# Patient Record
Sex: Male | Born: 1945
Health system: Southern US, Community
[De-identification: ages and names within clinical notes are randomized; demographics above are authoritative.]

## PROBLEM LIST (undated history)

## (undated) DIAGNOSIS — I509 Heart failure, unspecified: Secondary | ICD-10-CM

## (undated) DIAGNOSIS — R7303 Prediabetes: Secondary | ICD-10-CM

## (undated) HISTORY — DX: Prediabetes: R73.03

## (undated) HISTORY — DX: Heart failure, unspecified: I50.9

## (undated) HISTORY — PX: HERNIA REPAIR: SHX51

---

## 2014-03-01 HISTORY — PX: CYSTOSCOPY: SUR368

## 2014-07-01 ENCOUNTER — Encounter: Payer: Self-pay | Admitting: Family Medicine

## 2014-07-01 ENCOUNTER — Ambulatory Visit (INDEPENDENT_AMBULATORY_CARE_PROVIDER_SITE_OTHER): Payer: Commercial Managed Care - HMO | Admitting: Family Medicine

## 2014-07-01 VITALS — BP 132/82 | HR 75 | Ht 69.0 in | Wt 175.0 lb

## 2014-07-01 DIAGNOSIS — M545 Low back pain, unspecified: Secondary | ICD-10-CM | POA: Insufficient documentation

## 2014-07-01 DIAGNOSIS — E785 Hyperlipidemia, unspecified: Secondary | ICD-10-CM | POA: Insufficient documentation

## 2014-07-01 DIAGNOSIS — I10 Essential (primary) hypertension: Secondary | ICD-10-CM | POA: Insufficient documentation

## 2014-07-01 DIAGNOSIS — I739 Peripheral vascular disease, unspecified: Secondary | ICD-10-CM | POA: Diagnosis not present

## 2014-07-01 NOTE — Progress Notes (Signed)
CC: David Maldonado is a 69 y.o. male is here for Establish Care   Subjective: HPI:  Very pleasant 69 year old here to establish care.  Complains of a history of essential hypertension that's been spanning back for the last 9 years. He's been taking lisinopril for this and tells me that it's been well controlled ever since starting this low dose of 5 mg daily. Denies chest pain shortness of breath orthopnea nor peripheral edema. He exercises most days of the week and enjoys golfing  Reports a history of hyperlipidemia and has been taking pravastatin for an unknown amount of time. He was once on simvastatin however on the first day of taking this medication caused intolerable myalgias all over the body. He denies any known side effects from pravastatin. He denies any known cardiovascular disease other than hypertension and being told that he had asymptomatic peripheral arterial disease. He denies any limb claudication or chest pain with exertion.  His only complaint today is low back pain that hasn't present for at least a year now and is only present first thing in the morning for about 30 minutes. It's described as an ache improves with bending forward and nothing particularly makes it better or worse other than improvement with time. Symptom is mild in severity and had not been getting better or worsen the past year. He is extremely active during the day without his back pain present while swimming, play basketball or golfing. Denies radiation of pain, bowel or bladder incontinence  Review of Systems - General ROS: negative for - chills, fever, night sweats, weight gain or weight loss Ophthalmic ROS: negative for - decreased vision Psychological ROS: negative for - anxiety or depression ENT ROS: negative for - hearing change, nasal congestion, tinnitus or allergies Hematological and Lymphatic ROS: negative for - bleeding problems, bruising or swollen lymph nodes Breast ROS: negative Respiratory  ROS: no cough, shortness of breath, or wheezing Cardiovascular ROS: no chest pain or dyspnea on exertion Gastrointestinal ROS: no abdominal pain, change in bowel habits, or black or bloody stools Genito-Urinary ROS: negative for - genital discharge, genital ulcers, incontinence or abnormal bleeding from genitals Musculoskeletal ROS: negative for - joint pain or muscle pain other than that described above Neurological ROS: negative for - headaches or memory loss Dermatological ROS: negative for lumps, mole changes, rash and skin lesion changes  Past Medical History  Diagnosis Date  . CHF (congestive heart failure)     History reviewed. No pertinent past surgical history. History reviewed. No pertinent family history.  History   Social History  . Marital Status: Married    Spouse Name: N/A  . Number of Children: N/A  . Years of Education: N/A   Occupational History  . Not on file.   Social History Main Topics  . Smoking status: Current Every Day Smoker -- 0.50 packs/day    Types: Cigars  . Smokeless tobacco: Not on file  . Alcohol Use: Yes     Comment: 10 drinks a wk  . Drug Use: No  . Sexual Activity: No   Other Topics Concern  . Not on file   Social History Narrative  . No narrative on file     Objective: BP 132/82 mmHg  Pulse 75  Ht 5\' 9"  (1.753 m)  Wt 175 lb (79.379 kg)  BMI 25.83 kg/m2  General: Alert and Oriented, No Acute Distress HEENT: Pupils equal, round, reactive to light. Conjunctivae clear.  Moist mucous membranes Lungs: Clear to auscultation bilaterally, no  wheezing/ronchi/rales.  Comfortable work of breathing. Good air movement. Cardiac: Regular rate and rhythm. Normal S1/S2.  No murmurs, rubs, nor gallops.   Back: Full range of motion and strength in the lumbar spine Extremities: No peripheral edema.  Strong peripheral pulses.  Mental Status: No depression, anxiety, nor agitation. Skin: Warm and dry.  Assessment & Plan: [name] was seen today for  establish care.  Diagnoses and all orders for this visit:  Essential hypertension  Hyperlipidemia  Low back pain without sciatica, unspecified back pain laterality  Peripheral artery disease   Essential hypertension: Controlled on lisinopril 5 mg daily Hyperlipidemia: It sounds like he's had his cholesterol checked within the last year, requesting outside records from the [facility] continue pravastatin pending review of results Low back pain: Suspect osteoarthritis from the facet joints in the lumbar spine, he's not interested in medication which is understandable but is open to a home rehabilitative plan that I provided him today Peripheral artery disease: He is already on a statin and aspirin. Asymptomatic no further intervention.  Return for Schedule an annual physical exam sometime in the next 3 months, we'll do bloodwork duing that visit.Marland Kitchen

## 2014-09-06 LAB — LIPID PANEL
Cholesterol: 186 mg/dL (ref 0–200)
HDL: 40 mg/dL (ref 35–70)
LDL CALC: 108 mg/dL
TRIGLYCERIDES: 188 mg/dL — AB (ref 40–160)

## 2014-09-06 LAB — HEPATIC FUNCTION PANEL
ALT: 32 U/L (ref 10–40)
AST: 23 U/L (ref 14–40)
Alkaline Phosphatase: 66 U/L (ref 25–125)

## 2014-09-06 LAB — BASIC METABOLIC PANEL
Creatinine: 1 mg/dL (ref 0.6–1.3)
Glucose: 94 mg/dL
Potassium: 4.3 mmol/L (ref 3.4–5.3)
SODIUM: 139 mmol/L (ref 137–147)

## 2014-09-06 LAB — CBC AND DIFFERENTIAL
Hemoglobin: 14.7 g/dL (ref 13.5–17.5)
Platelets: 313 10*3/uL (ref 150–399)
WBC: 6.4 10^3/mL

## 2014-09-06 LAB — COMPREHENSIVE METABOLIC PANEL
CALCIUM: 8.7
Protein, Total: 8.5

## 2014-09-06 LAB — VITAMIN D 25 HYDROXY (VIT D DEFICIENCY, FRACTURES): Vit D, 25-Hydroxy: 36.58

## 2014-10-01 ENCOUNTER — Encounter: Payer: Self-pay | Admitting: Family Medicine

## 2014-10-01 ENCOUNTER — Ambulatory Visit (INDEPENDENT_AMBULATORY_CARE_PROVIDER_SITE_OTHER): Payer: Commercial Managed Care - HMO | Admitting: Family Medicine

## 2014-10-01 VITALS — BP 134/89 | HR 59 | Ht 69.0 in | Wt 171.0 lb

## 2014-10-01 DIAGNOSIS — Z0000 Encounter for general adult medical examination without abnormal findings: Secondary | ICD-10-CM

## 2014-10-01 DIAGNOSIS — Z1211 Encounter for screening for malignant neoplasm of colon: Secondary | ICD-10-CM

## 2014-10-01 DIAGNOSIS — E785 Hyperlipidemia, unspecified: Secondary | ICD-10-CM

## 2014-10-01 DIAGNOSIS — I10 Essential (primary) hypertension: Secondary | ICD-10-CM

## 2014-10-01 MED ORDER — PRAVASTATIN SODIUM 40 MG PO TABS: 40.0000 mg | ORAL_TABLET | Freq: Every day | ORAL | Status: DC

## 2014-10-01 MED ORDER — LISINOPRIL 20 MG PO TABS: 10.0000 mg | ORAL_TABLET | Freq: Two times a day (BID) | ORAL | Status: DC

## 2014-10-01 NOTE — Progress Notes (Signed)
Subjective:    David Maldonado is a 69 y.o. male who presents for Medicare Annual/Subsequent preventive examination.   Preventive Screening-Counseling & Management  Tobacco History  Smoking status  . Current Every Day Smoker -- 0.50 packs/day  . Types: Cigars  Smokeless tobacco  . Not on file   Colonoscopy: Overdue, referral placed today and urged to go though with procedure once contacted  Prostate: Discussed screening risks/beneifts with patient today, PSA in July was normal  Influenza Vaccine:  Declined Pneumovax: Declined Td/Tdap: Up-to-date Zoster: Declined  Recently had cholesterol done, LDL was just barely above 100, his [place] doctor has recommended he increase pravastatin. He states he is not interested in this.   Problems Prior to Visit 1. HLD, HTN, PVD  Current Problems (verified) Patient Active Problem List   Diagnosis Date Noted  . Essential hypertension 07/01/2014  . Hyperlipidemia 07/01/2014  . Lumbago 07/01/2014  . Peripheral artery disease 07/01/2014    Medications Prior to Visit Current Outpatient Prescriptions on File Prior to Visit  Medication Sig Dispense Refill  . aspirin 81 MG tablet Take 81 mg by mouth daily.     No current facility-administered medications on file prior to visit.    Current Medications (verified) Current Outpatient Prescriptions  Medication Sig Dispense Refill  . aspirin 81 MG tablet Take 81 mg by mouth daily.    Marland Kitchen lisinopril (PRINIVIL,ZESTRIL) 20 MG tablet Take 0.5 tablets (10 mg total) by mouth 2 (two) times daily. Take one tablet in the morning and one in the evening 90 tablet 2  . pravastatin (PRAVACHOL) 40 MG tablet Take 1 tablet (40 mg total) by mouth daily. 90 tablet 2   No current facility-administered medications for this visit.     Allergies (verified) Simvastatin   PAST HISTORY  Family History No family history on file.  Social History History  Substance Use Topics  . Smoking status: Current Every Day  Smoker -- 0.50 packs/day    Types: Cigars  . Smokeless tobacco: Not on file  . Alcohol Use: Yes     Comment: 10 drinks a wk    Are there smokers in your home (other than you)?  No  Risk Factors Current exercise habits: Home exercise routine includes swimming, golf.  Dietary issues discussed: DASH   Cardiac risk factors: advanced age (older than 54 for men, 1 for women), dyslipidemia, hypertension, male gender and smoking/ tobacco exposure.  Depression Screen (Note: if answer to either of the following is "Yes", a more complete depression screening is indicated)   Q1: Over the past two weeks, have you felt down, depressed or hopeless? No  Q2: Over the past two weeks, have you felt little interest or pleasure in doing things? No  Have you lost interest or pleasure in daily life? No  Do you often feel hopeless? No  Do you cry easily over simple problems? No  Activities of Daily Living In your present state of health, do you have any difficulty performing the following activities?:  Driving? No Managing money?  No Feeding yourself? No Getting from bed to chair? No Climbing a flight of stairs? No Preparing food and eating?: No Bathing or showering? No Getting dressed: No Getting to the toilet? No Using the toilet:No Moving around from place to place: No In the past year have you fallen or had a near fall?:No   Are you sexually active?  No  Do you have more than one partner?  No  Hearing Difficulties: No Do you  often ask people to speak up or repeat themselves? No Do you experience ringing or noises in your ears? No Do you have difficulty understanding soft or whispered voices? No   Do you feel that you have a problem with memory? No  Do you often misplace items? No  Do you feel safe at home?  Yes  Cognitive Testing  Alert? Yes  Normal Appearance?Yes  Oriented to person? Yes  Place? Yes   Time? Yes  Recall of three objects?  Yes  Can perform simple calculations?  Yes  Displays appropriate judgment?Yes  Can read the correct time from a watch face?Yes   Advanced Directives have been discussed with the patient? Yes   List the Names of Other Physician/Practitioners you currently use: 1.  [facility] Rodney Langton  Indicate any recent Medical Services you may have received from other than [facility] providers in the past year (date may be approximate).  Immunization History  Administered Date(s) Administered  . Tdap 03/01/2009    Screening Tests Health Maintenance  Topic Date Due  . TETANUS/TDAP  05/10/1966  . COLONOSCOPY  05/09/1997  . INFLUENZA VACCINE  09/30/2014  . ZOSTAVAX  10/01/2034 (Originally 05/10/2007)  . PNA vac Low Risk Adult (1 of 2 - PCV13) 10/01/2034 (Originally 05/09/2012)    All answers were reviewed with the patient and necessary referrals were made:  Marcial Pacas, DO   10/01/2014   History reviewed: allergies, current medications, past family history, past medical history, past social history, past surgical history and problem list  Review of Systems Review of Systems - General ROS: negative for - chills, fever, night sweats, weight gain or weight loss Ophthalmic ROS: negative for - decreased vision Psychological ROS: negative for - anxiety or depression ENT ROS: negative for - hearing change, nasal congestion, tinnitus or allergies Hematological and Lymphatic ROS: negative for - bleeding problems, bruising or swollen lymph nodes Breast ROS: negative Respiratory ROS: no cough, shortness of breath, or wheezing Cardiovascular ROS: no chest pain or dyspnea on exertion Gastrointestinal ROS: no abdominal pain, change in bowel habits, or black or bloody stools Genito-Urinary ROS: negative for - genital discharge, genital ulcers, incontinence or abnormal bleeding from genitals Musculoskeletal ROS: negative for - joint pain or muscle pain Neurological ROS: negative for - headaches or memory loss Dermatological ROS: negative for lumps,  mole changes, rash and skin lesion changes   Objective:     Vision by Snellen chart: right eye:20/20, left eye:20/30 Blood pressure 134/89, pulse 59, height 5\' 9"  (1.753 m), weight 171 lb (77.565 kg). Body mass index is 25.24 kg/(m^2).  General: No Acute Distress HEENT: Atraumatic, normocephalic, conjunctivae normal without scleral icterus.  No nasal discharge, hearing grossly intact, TMs with good landmarks bilaterally with no middle ear abnormalities, posterior pharynx clear without oral lesions. Neck: Supple, trachea midline, no cervical nor supraclavicular adenopathy. Pulmonary: Clear to auscultation bilaterally without wheezing, rhonchi, nor rales. Cardiac: Regular rate and rhythm.  No murmurs, rubs, nor gallops. No peripheral edema.  2+ peripheral pulses bilaterally. Abdomen: Bowel sounds normal.  No masses.  Non-tender without rebound.  Negative Murphy's sign. GU: Bilateral descended testes no inguinal hernia  MSK: Grossly intact, no signs of weakness.  Full strength throughout upper and lower extremities.  Full ROM in upper and lower extremities.  No midline spinal tenderness. Neuro: Gait unremarkable, CN II-XII grossly intact.  C5-C6 Reflex 2/4 Bilaterally, L4 Reflex 2/4 Bilaterally.  Cerebellar function intact. Skin: No rashes. Psych: Alert and oriented to person/place/time.  Thought  process normal. No anxiety/depression.      Assessment:     Overdue for colonoscopy, declines multiple immunizations today.     Plan:     During the course of the visit the patient was educated and counseled about appropriate screening and preventive services including:    Colorectal cancer screening  Diet review for nutrition referral? No  Brings in blood work from the [place] showing mild elevation in LDL cholesterol, urged to increase his pravastatin to 80 mg daily, he politely declines even have explained to him that this will help reduce progression of peripheral vascular disease and  should reduce chance of stroke and heart attack. Up-to-date on renal and liver function.  Patient Instructions (the written plan) was given to the patient.  Medicare Attestation I have personally reviewed: The patient's medical and social history Their use of alcohol, tobacco or illicit drugs Their current medications and supplements The patient's functional ability including ADLs,fall risks, home safety risks, cognitive, and hearing and visual impairment Diet and physical activities Evidence for depression or mood disorders  The patient's weight, height, BMI, and visual acuity have been recorded in the chart.  I have made referrals, counseling, and provided education to the patient based on review of the above and I have provided the patient with a written personalized care plan for preventive services.     Marcial Pacas, DO   10/01/2014

## 2014-10-02 ENCOUNTER — Telehealth: Payer: Self-pay | Admitting: Family Medicine

## 2014-10-02 DIAGNOSIS — I10 Essential (primary) hypertension: Secondary | ICD-10-CM

## 2014-10-02 MED ORDER — LISINOPRIL 20 MG PO TABS: 10.0000 mg | ORAL_TABLET | Freq: Two times a day (BID) | ORAL | Status: DC

## 2014-10-02 NOTE — Telephone Encounter (Signed)
lisonopril clarification

## 2014-10-10 ENCOUNTER — Encounter: Payer: Self-pay | Admitting: Family Medicine

## 2015-04-03 ENCOUNTER — Encounter: Payer: Self-pay | Admitting: Family Medicine

## 2015-04-03 ENCOUNTER — Ambulatory Visit (INDEPENDENT_AMBULATORY_CARE_PROVIDER_SITE_OTHER): Payer: Commercial Managed Care - HMO | Admitting: Family Medicine

## 2015-04-03 VITALS — BP 137/83 | HR 56 | Wt 180.0 lb

## 2015-04-03 DIAGNOSIS — Z86010 Personal history of colonic polyps: Secondary | ICD-10-CM

## 2015-04-03 DIAGNOSIS — E785 Hyperlipidemia, unspecified: Secondary | ICD-10-CM

## 2015-04-03 DIAGNOSIS — I10 Essential (primary) hypertension: Secondary | ICD-10-CM | POA: Diagnosis not present

## 2015-04-03 MED ORDER — LISINOPRIL 20 MG PO TABS: 10.0000 mg | ORAL_TABLET | Freq: Two times a day (BID) | ORAL | Status: DC

## 2015-04-03 MED ORDER — PRAVASTATIN SODIUM 40 MG PO TABS: 40.0000 mg | ORAL_TABLET | Freq: Every day | ORAL | Status: DC

## 2015-04-03 NOTE — Progress Notes (Signed)
CC: David Maldonado is a 70 y.o. male is here for Hypertension   Subjective: HPI:  FU HTN: Taking lisinopril with 100% compliance. No outside BPs to report[facility] No chest pain, shortness of breath, orthopnea no peripheral edema.  Follow-up hyperlipidemia: Taking pravastatin without any myalgias or right upper quadrant pain. Denies limb claudication.   Review Of Systems Outlined In HPI  Past Medical History  Diagnosis Date  . CHF (congestive heart failure) [facility]     Past Surgical History  Procedure Laterality Date  . Hernia repair     No family history on file.  Social History   Social History  . Marital Status: Married    Spouse Name: N/A  . Number of Children: N/A  . Years of Education: N/A   Occupational History  . Not on file.   Social History Main Topics  . Smoking status: Current Every Day Smoker -- 0.50 packs/day    Types: Cigars  . Smokeless tobacco: Not on file  . Alcohol Use: Yes     Comment: 10 drinks a wk  . Drug Use: No  . Sexual Activity: No   Other Topics Concern  . Not on file   Social History Narrative     Objective: BP 137/83 mmHg  Pulse 56  Wt 180 lb (81.647 kg)  General: Alert and Oriented, No Acute Distress HEENT: Pupils equal, round, reactive to light. Conjunctivae clear.  Moist mucous membranes Lungs: Clear to auscultation bilaterally, no wheezing/ronchi/rales.  Comfortable work of breathing. Good air movement. Cardiac: Regular rate and rhythm. Normal S1/S2.  No murmurs, rubs, nor gallops.  No carotid bruit Extremities: No peripheral edema.  Strong peripheral pulses.  Mental Status: No depression, anxiety, nor agitation. Skin: Warm and dry.  Assessment & Plan: David "ERNIE" was seen today for hypertension.  Diagnoses and all orders for this visit:  Essential hypertension -     lisinopril (PRINIVIL,ZESTRIL) 20 MG tablet; Take 0.5 tablets (10 mg total) by mouth 2 (two) times daily.  Hyperlipidemia -      pravastatin (PRAVACHOL) 40 MG tablet; Take 1 tablet (40 mg total) by mouth daily.   Essential hypertension: Controlled continue lisinopril twice a day. Hyperlipidemia: Clinically controlled, he'll be due for repeat lipid panel in the summer. Continue pravastatin. He plans on getting the lipid panel at the [place] have asked him to share the results I can help him with interpretation.  Return in about 6 months (around 10/01/2015).

## 2015-04-03 NOTE — Addendum Note (Signed)
Addended by: Marcial Pacas on: 04/03/2015 08:49 AM   Modules accepted: Orders

## 2015-04-03 NOTE — Progress Notes (Signed)
5 years since last colonoscopy where 3 non-cancerous polyps were removed.  Needs referral for repeat colonoscopy.

## 2015-05-20 ENCOUNTER — Encounter: Payer: Self-pay | Admitting: Family Medicine

## 2015-05-20 DIAGNOSIS — Z86010 Personal history of colonic polyps: Secondary | ICD-10-CM | POA: Insufficient documentation

## 2015-05-20 DIAGNOSIS — D122 Benign neoplasm of ascending colon: Secondary | ICD-10-CM | POA: Diagnosis not present

## 2015-05-20 DIAGNOSIS — D123 Benign neoplasm of transverse colon: Secondary | ICD-10-CM | POA: Diagnosis not present

## 2015-05-20 DIAGNOSIS — D120 Benign neoplasm of cecum: Secondary | ICD-10-CM | POA: Diagnosis not present

## 2015-09-26 ENCOUNTER — Encounter: Payer: Self-pay | Admitting: Family Medicine

## 2015-09-26 ENCOUNTER — Ambulatory Visit (INDEPENDENT_AMBULATORY_CARE_PROVIDER_SITE_OTHER): Payer: Commercial Managed Care - HMO | Admitting: Family Medicine

## 2015-09-26 VITALS — BP 131/82 | HR 54 | Wt 173.0 lb

## 2015-09-26 DIAGNOSIS — E785 Hyperlipidemia, unspecified: Secondary | ICD-10-CM | POA: Diagnosis not present

## 2015-09-26 DIAGNOSIS — I10 Essential (primary) hypertension: Secondary | ICD-10-CM | POA: Diagnosis not present

## 2015-09-26 MED ORDER — PRAVASTATIN SODIUM 40 MG PO TABS: 40.0000 mg | ORAL_TABLET | Freq: Every day | ORAL | 2 refills | Status: DC

## 2015-09-26 MED ORDER — LISINOPRIL 20 MG PO TABS: 10.0000 mg | ORAL_TABLET | Freq: Two times a day (BID) | ORAL | 2 refills | Status: DC

## 2015-09-26 NOTE — Progress Notes (Signed)
CC: David Maldonado is a 70 y.o. male is here for Hypertension (6 month f/u)   Subjective: HPI:  Follow-up essential hypertension: No outside blood pressures to report. He is taking lisinopril twice a day. He denies chest pain shortness of breath orthopnea nor peripheral edema. He is taking an aspirin a day.  Follow-up hyperlipidemia: He [facility] normal along with cholesterol which was normal. He is taking pravastatin on a daily basis. He denies any limb claudication or right upper quadrant pain nor myalgias.     Review Of Systems Outlined In HPI  Past Medical History:  Diagnosis Date  . CHF (congestive heart failure) ([place])     Past Surgical History:  Procedure Laterality Date  . HERNIA REPAIR     No family history on file.  Social History   Social History  . Marital status: Married    Spouse name: N/A  . Number of children: N/A  . Years of education: N/A   Occupational History  . Not on file.   Social History Main Topics  . Smoking status: Current Every Day Smoker    Packs/day: 0.50    Types: Cigars  . Smokeless tobacco: Not on file  . Alcohol use Yes     Comment: 10 drinks a wk  . Drug use: No  . Sexual activity: No   Other Topics Concern  . Not on file   Social History Narrative  . No narrative on file     Objective: BP 131/82   Pulse (!) 54   Wt 173 lb (78.5 kg)   BMI 25.55 kg/m   General: Alert and Oriented, No Acute Distress HEENT: Pupils equal, round, reactive to light. Conjunctivae clear.  Moist mucous membranes Lungs: Clear to auscultation bilaterally, no wheezing/ronchi/rales.  Comfortable work of breathing. Good air movement. Cardiac: Regular rate and rhythm. Normal S1/S2.  No murmurs, rubs, nor gallops.  No carotid bruit Extremities: No peripheral edema.  Strong peripheral pulses.  Mental Status: No depression, anxiety, nor agitation. Skin: Warm and dry.  Assessment  & Plan: David "ERNIE" was seen today for hypertension.  Diagnoses and all orders for this visit:  Essential hypertension -     lisinopril (PRINIVIL,ZESTRIL) 20 MG tablet; Take 0.5 tablets (10 mg total) by mouth 2 (two) times daily.  Hyperlipidemia -     pravastatin (PRAVACHOL) 40 MG tablet; Take 1 tablet (40 mg total) by mouth daily.  Essential hypertension: Controlled continue lisinopril Hyperlipidemia: Controlled based [facility], continue pravastatin daily. Today he again declines the shingles vaccine or either pneumonia vaccines.  Discussed with this patient that I will be resigning from my position here with [facility] in September in order to stay with my family who will be moving to [facility]. I let him know about the providers that are still accepting patients and I feel that this individual will be under great care if he/she stays here with [facility].   Return in about 6 months (around 03/28/2016).

## 2016-04-07 LAB — BASIC METABOLIC PANEL
CREATININE: 1 (ref 0.6–1.3)
POTASSIUM: 4.7 (ref 3.4–5.3)
Sodium: 141 (ref 137–147)

## 2016-04-07 LAB — CBC AND DIFFERENTIAL
HCT: 43 (ref 41–53)
HEMOGLOBIN: 14.2 (ref 13.5–17.5)
Platelets: 331 (ref 150–399)
WBC: 7.3

## 2016-04-07 LAB — LIPID PANEL
CHOLESTEROL: 190 (ref 0–200)
HDL: 58 (ref 35–70)
LDL Cholesterol: 102
TRIGLYCERIDES: 148 (ref 40–160)

## 2016-04-07 LAB — PSA: PSA: 1.12

## 2016-04-07 LAB — HEMOGLOBIN A1C: HEMOGLOBIN A1C: 5.9

## 2016-04-07 LAB — TSH: TSH: 2.1 (ref 0.41–5.90)

## 2016-09-15 ENCOUNTER — Ambulatory Visit (INDEPENDENT_AMBULATORY_CARE_PROVIDER_SITE_OTHER): Payer: Medicare HMO | Admitting: Osteopathic Medicine

## 2016-09-15 VITALS — BP 108/68 | HR 72 | Wt 168.0 lb

## 2016-09-15 DIAGNOSIS — E782 Mixed hyperlipidemia: Secondary | ICD-10-CM

## 2016-09-15 DIAGNOSIS — I10 Essential (primary) hypertension: Secondary | ICD-10-CM | POA: Diagnosis not present

## 2016-09-15 DIAGNOSIS — Z0000 Encounter for general adult medical examination without abnormal findings: Secondary | ICD-10-CM

## 2016-09-15 DIAGNOSIS — I739 Peripheral vascular disease, unspecified: Secondary | ICD-10-CM | POA: Diagnosis not present

## 2016-09-15 DIAGNOSIS — E7439 Other disorders of intestinal carbohydrate absorption: Secondary | ICD-10-CM

## 2016-09-15 DIAGNOSIS — Z86010 Personal history of colonic polyps: Secondary | ICD-10-CM | POA: Diagnosis not present

## 2016-09-15 LAB — POCT GLYCOSYLATED HEMOGLOBIN (HGB A1C): Hemoglobin A1C: 5.7

## 2016-09-15 MED ORDER — ASPIRIN 81 MG PO TABS: 81.0000 mg | ORAL_TABLET | Freq: Every day | ORAL | 3 refills | Status: AC

## 2016-09-15 MED ORDER — LISINOPRIL 20 MG PO TABS: 20.0000 mg | ORAL_TABLET | Freq: Every day | ORAL | 3 refills | Status: DC

## 2016-09-15 MED ORDER — PRAVASTATIN SODIUM 40 MG PO TABS: 40.0000 mg | ORAL_TABLET | Freq: Every day | ORAL | 3 refills | Status: DC

## 2016-09-15 NOTE — Progress Notes (Signed)
HPI: David Maldonado is a 71 y.o. male  who presents to [facility] today, 09/15/16,  for chief complaint of:  Chief Complaint  Patient presents with  . Annual Exam    Annual physical exam - see preventive care as reviewed below  Essential hypertension - doing well on meds, not sure why he is splitting dose, he never had problems with whole tablet he can recall  Mixed hyperlipidemia - cholesterol good on recent labs  History of colonic polyps  Glucose intolerance - A1C in pre-DM range o [facility] labs, [facility]     Past medical, surgical, social and family history reviewed: Patient Active Problem List   Diagnosis Date Noted  . History of colonic polyps 05/20/2015  . Essential hypertension 07/01/2014  . Hyperlipidemia 07/01/2014  . Lumbago 07/01/2014  . Peripheral artery disease ([place]) 07/01/2014   Past Surgical History:  Procedure Laterality Date  . HERNIA REPAIR     Social History  Substance Use Topics  . Smoking status: Current Every Day Smoker    Packs/day: 0.50    Types: Cigars  . Smokeless tobacco: Not on file  . Alcohol use Yes     Comment: 10 drinks a wk   No family history on file.   Current medication list and allergy/intolerance information reviewed:   Current Outpatient Prescriptions  Medication Sig Dispense Refill  . aspirin 81 MG tablet Take 81 mg by mouth daily.    Marland Kitchen lisinopril (PRINIVIL,ZESTRIL) 20 MG tablet Take 0.5 tablets (10 mg total) by mouth 2 (two) times daily. 90 tablet 2  . pravastatin (PRAVACHOL) 40 MG tablet Take 1 tablet (40 mg total) by mouth daily. 90 tablet 2   No current facility-administered medications for this visit.    Allergies  Allergen Reactions  . Simvastatin     myalgia      Review of Systems:  Constitutional:  No  fever, no chills, No recent illness, No unintentional weight changes. No significant fatigue.    HEENT: No  headache, no  vision change, no hearing change, No sore throat, No  sinus pressure  Cardiac: No  chest pain, No  pressure, No palpitations  Respiratory:  No  shortness of breath. No  Cough  Gastrointestinal: No  abdominal pain, No  nausea, No  vomiting,  No  blood in stool, No  diarrhea, No  constipation   Musculoskeletal: No new myalgia/arthralgia  Genitourinary: No  incontinence, +occasionall dribbling urine, only at night .No  abnormal genital bleeding, No abnormal genital discharge  Skin: No  Rash  Hem/Onc: No  easy bruising/bleeding,  Endocrine:  No polyuria/polydipsia/polyphagia   Neurologic: No  weakness, No  dizziness,  Psychiatric: No  concerns with depression, No  concerns with anxiety, No sleep problems, No mood problems  Exam:  BP 108/68   Pulse 72   Wt 168 lb (76.2 kg)   SpO2 96%   BMI 24.81 kg/m   Constitutional: VS see above. General Appearance: alert, well-developed, well-nourished, NAD  Eyes: Normal lids and conjunctive, non-icteric sclera  Ears, Nose, Mouth, Throat: MMM, Normal external inspection ears/nares/mouth/lips/gums. TM normal bilaterally. Pharynx/tonsils no erythema, no exudate. Nasal mucosa normal.   Neck: No masses, trachea midline. No thyroid enlargement. No tenderness/mass appreciated. No lymphadenopathy  Respiratory: Normal respiratory effort. no wheeze, no rhonchi, no rales  Cardiovascular: S1/S2 normal, no murmur, no rub/gallop auscultated. RRR. No lower extremity edema.   Gastrointestinal: Nontender, no masses.  No hepatomegaly, no splenomegaly. No hernia appreciated. Bowel sounds normal. Rectal exam deferred.   Musculoskeletal: Gait normal. No clubbing/cyanosis of digits.   Neurological: Normal balance/coordination. No tremor. No cranial nerve deficit on limited exam.  Skin: warm, dry, intact. No rash/ulcer.  Psychiatric: Normal judgment/insight. Normal mood and affect. Oriented x3.    Results for orders placed or performed in visit on  09/15/16 (from the past 72 hour(s))  POCT glycosylated hemoglobin (Hb A1C)     Status: None   Collection Time: 09/15/16 10:20 AM  Result Value Ref Range   Hemoglobin A1C 5.7      ASSESSMENT/PLAN:   Annual physical exam  Essential hypertension - Plan: lisinopril (PRINIVIL,ZESTRIL) 20 MG tablet, aspirin 81 MG tablet  Peripheral artery disease (HCC)  Mixed hyperlipidemia - Plan: pravastatin (PRAVACHOL) 40 MG tablet  History of colonic polyps  Glucose intolerance - Plan: POCT glycosylated hemoglobin (Hb A1C), CANCELED: POCT glycosylated hemoglobin (Hb A1C)    MALE PREVENTIVE CARE  updated 09/15/16  ANNUAL SCREENING/COUNSELING  Any changes to health in the past year? no  Diet/Exercise - HEALTHY HABITS DISCUSSED TO DECREASE CV RISK History  Smoking Status  . Current Every Day Smoker  . Packs/day: 0.50  . Types: Cigars  Smokeless Tobacco  . Not on file   History  Alcohol Use  . Yes    Comment: 10 drinks a wk   Depression screen PHQ 2/9 09/15/2016  Decreased Interest 0  Down, Depressed, Hopeless 0  PHQ - 2 Score 0   SEXUAL/REPRODUCTIVE HEALTH  Sexually active in the past year? - Yes with male.  STI testing needed/desired today? - no  Any concerns with testosterone/libido? - no  INFECTIOUS DISEASE SCREENING  HIV - does not need - declined  GC/CT - does not need  HepC - does not need - declined  TB - does not need  CANCER SCREENING  Lung - USPSTF: 55-80yo w/ 30 py hx unless quit w/in 58yr - does not need - states this was done at [facility] - does not need - following with GI  Prostate - does not need - PSA in nml range 04/2016  OTHER DISEASE SCREENING  Lipid - does not need  DM2 - does not need  AAA - 65-75yo ever smoked: does not need - states had this at the [place] but unclear if specific for AAA, we need records from the [place]   Osteoporosis - men 70yo+ - does not need until next year   ADULT VACCINATION  Influenza - annual vaccine  recommended  Td - booster every 10 years   Zoster - option at 55, yes at 60+   PCV13 - was not indicated  PPSV23 - was offered and declined by the patient Immunization History  Administered Date(s) Administered  . Tdap 03/01/2009      Visit summary with medication list and pertinent instructions was printed for patient to review. All questions at time of visit were answered - patient instructed to contact office with any additional concerns. ER/RTC precautions were reviewed with the patient. Follow-up plan: Return in about 4 months (around 01/16/2017) for RECHECK BLOOD PRESSURE AND SUGARS.

## 2016-10-01 ENCOUNTER — Encounter: Payer: Self-pay | Admitting: Osteopathic Medicine

## 2016-10-01 LAB — URINALYSIS, DIPSTICK ONLY
BILIRUBIN (URINE): NEGATIVE
KETONES, URINE: NEGATIVE
Nitrite, UA: NEGATIVE — AB
PH URINE, INITIAL: 5
Protein, 24H Urine: NEGATIVE
SPEC GRAV FL: 1.005
URINE GLUCOSE: NEGATIVE

## 2016-10-11 ENCOUNTER — Encounter: Payer: Self-pay | Admitting: Physician Assistant

## 2016-10-11 ENCOUNTER — Ambulatory Visit (INDEPENDENT_AMBULATORY_CARE_PROVIDER_SITE_OTHER): Payer: Medicare HMO | Admitting: Physician Assistant

## 2016-10-11 VITALS — BP 127/79 | HR 61 | Temp 97.7°F | Ht 69.0 in | Wt 167.0 lb

## 2016-10-11 DIAGNOSIS — R1030 Lower abdominal pain, unspecified: Secondary | ICD-10-CM | POA: Diagnosis not present

## 2016-10-11 DIAGNOSIS — R3912 Poor urinary stream: Secondary | ICD-10-CM | POA: Diagnosis not present

## 2016-10-11 DIAGNOSIS — R34 Anuria and oliguria: Secondary | ICD-10-CM | POA: Diagnosis not present

## 2016-10-11 DIAGNOSIS — N401 Enlarged prostate with lower urinary tract symptoms: Secondary | ICD-10-CM

## 2016-10-11 LAB — POCT URINALYSIS DIPSTICK
Bilirubin, UA: NEGATIVE
GLUCOSE UA: NEGATIVE
Ketones, UA: NEGATIVE
LEUKOCYTES UA: NEGATIVE
NITRITE UA: NEGATIVE
PH UA: 6 (ref 5.0–8.0)
Protein, UA: NEGATIVE
Spec Grav, UA: 1.02 (ref 1.010–1.025)
UROBILINOGEN UA: 0.2 U/dL

## 2016-10-11 MED ORDER — LEVOFLOXACIN 500 MG PO TABS: 500.0000 mg | ORAL_TABLET | Freq: Every day | ORAL | 0 refills | Status: AC

## 2016-10-11 MED ORDER — TAMSULOSIN HCL 0.4 MG PO CAPS: 0.4000 mg | ORAL_CAPSULE | Freq: Every day | ORAL | 1 refills | Status: DC

## 2016-10-11 NOTE — Patient Instructions (Signed)
- Antibiotic once a day for 10 days - Start Flomax for BPH. The first dose can cause a little drop in blood pressure, so recommend taking it 30 minutes after your evening meal. Alternatively, can take 30 minutes after breakfast if you are getting up to urinate more frequently at night. - To the Emergency Room if unable to void - Follow-up with PCP in 2 weeks    Benign Prostatic Hyperplasia Benign prostatic hyperplasia is when the prostate gland is bigger than normal (enlarged). The prostate is a gland that produces the fluid that goes into semen. It is near the opening to the bladder and it surrounds the tube that drains urine out of the body (urethra). Benign prostatic hyperplasia is common among older men and it typically causes problems with urinating. The prostate grows slowly as you age. As the prostate grows, it can pinch the urethra. This causes the bladder to work too hard to pass urine, which leads to a thickened bladder wall. The bladder may eventually become weak and unable to empty completely. What are the causes? The exact cause of this condition is not known. It may be related to changes in hormones as the body ages. What increases the risk? You are more likely to develop this condition if:  You have a family history of the condition.  You are age 31 or older.  You have a history of erectile dysfunction.  You do not exercise.  You have certain medical conditions, including: ? Type 2 diabetes. ? Obesity. ? Heart and circulatory disease.  What are the signs or symptoms? Symptoms of this condition include:  Weak or interrupted urine stream.  Dribbling or leaking urine.  Feeling like the bladder has not emptied completely.  Difficulty starting urination.  Getting up frequently at night to urinate.  Urinating more often (8 or more times a day).  Accidental loss of urine (urinary incontinence).  Pain during urination or ejaculation.  Urine with an unusual smell  or color.  The size of the prostate does not always determine the severity of the symptoms. For example, a man with a large prostate may experience minor symptoms, or a man with a smaller prostate may experience a severe blockage. How is this diagnosed? This condition may be diagnosed based on:  Your medical history and symptoms.  A physical exam. This usually includes a digital rectal exam. During this exam, your health care provider places a gloved, lubricated finger into the rectum to feel the size of the prostate.  A blood test. This test checks for high levels of a protein that is produced by the prostate (prostate specific antigen, PSA).  Tests to examine how well the urethra and bladder are functioning (urodynamic tests).  Cystoscopy. For this test, a small, tube-shaped instrument (cystoscope) is used to look inside the urethra and bladder. The cystoscope is placed into the urinary tract through the opening at the tip of the penis.  Urine tests.  Ultrasound.  How is this treated? Treatment for this condition depends on how severe your symptoms are. Treatment may include:  Active surveillance or "watchful waiting." If your symptoms are mild, your health care provider may delay treatment and ask you to keep track of your symptoms. You will have regular checkups to examine the size of your prostate, discuss symptoms, and determine whether treatment is needed.  Medicines. These may be used to: ? Stop prostate growth. ? Shrink the prostate. ? Relieve symptoms.  Lifestyle changes, including: ? Pelvic floor  muscle exercises. The pelvic floor muscles are a group of muscles that relax when you urinate. ? Bladder training. This involves exercises that train the bladder to hold more urine for longer periods. ? Reducing the amount of liquid that you drink. This is especially important before sleeping and before long periods of time spent in public. ? Reducing the amount of caffeine and  alcohol that you drink. ? Treating or preventing constipation.  Surgery to reduce the size of the prostate or widen the urethra. This is typically done if your symptoms are severe or there are serious complications from the enlarged prostate.  Follow these instructions at home: Medicines  Take over-the-counter and prescription medicines as told by your health care provider.  Avoid certain medicines, such as decongestants, antihistamines, and some prescription medicines as told by your health care provider. Ask your health care provider which medicines you should avoid. General instructions  Monitor your symptoms for any changes. Tell your health care provider about any changes.  Give yourself time when you urinate.  Avoid certain beverages that can irritate the bladder, such as: ? Alcohol. ? Caffeinated drinks like coffee, tea, and cola.  Avoid drinking large amounts of liquid before bed or before going out in public.  Do pelvic floor muscle or bladder training exercises as told by your health care provider.  Keep all follow-up visits as told by your health care provider. This is important. Contact a health care provider if:  Your develop new or worse symptoms.  You have trouble getting or maintaining an erection.  You have a fever.  You have pain or burning during urination.  You have blood in your urine. Get help right away if:  You have severe pain when urinating.  You cannot urinate.  You have severe pain in your abdomen.  You are dizzy.  You faint.  You have severe back pain.  Your urine is dark red and difficult to see through.  You have large blood clots in your urine.  You have severe pain after an erection.  You have chest pain, dizziness, or nausea during sexual activity. Summary  The prostate is a gland that produces the fluid that goes into semen. It is near the opening to the bladder and it surrounds the tube that drains urine out of the body  (urethra).  Benign prostatic hyperplasia is common among older men and it typically causes problems with urinating.  If your symptoms are mild, your health care provider may delay treatment and ask you to keep track of your symptoms. You will have regular checkups to examine the size of your prostate, discuss symptoms, and determine whether treatment is needed.  If directed, you may need to avoid certain medicines, such as decongestants, antihistamines, and some prescription medicines.  Contact your health care provider if you develop new or worse symptoms. This information is not intended to replace advice given to you by your health care provider. Make sure you discuss any questions you have with your health care provider. Document Released: 02/15/2005 Document Revised: 01/05/2016 Document Reviewed: 01/05/2016 Elsevier Interactive Patient Education  2017 [employer].

## 2016-10-11 NOTE — Progress Notes (Signed)
HPI:                                                                David Maldonado is a 71 y.o. male who presents to [facility] today for abdominal pain/urinary symptoms  Patient  and decreased urine output for over a month. Reports hesitancy and decreased urinary flow, especially at night. Nocturia twice per night. Denies post-void dribbling. Denies  Patient reports developing lower abdominal discomfort x 2 days. Describes pain as a dull fullness. Pain is constant. Denies fever, chills, nightsweats, unintentional weight loss. Denies urethral discharge or abnormal ejaculate. Denies nausea, vomiting, diarrhea. Reports history of prostatitis 20 years ago.  Denies history of prostate cancer. Reports normal PSA.  Denies new sexual partners.   Past Medical History:  Diagnosis Date  . CHF (congestive heart failure) ([place])    Past Surgical History:  Procedure Laterality Date  . HERNIA REPAIR     Social History  Substance Use Topics  . Smoking status: Current Every Day Smoker    Packs/day: 0.50    Types: Cigars  . Smokeless tobacco: Never Used  . Alcohol use Yes     Comment: 10 drinks a wk   family history is not on file.  ROS: negative except as noted in the HPI  Medications: Current Outpatient Prescriptions  Medication Sig Dispense Refill  . aspirin 81 MG tablet Take 1 tablet (81 mg total) by mouth daily. 90 tablet 3  . lisinopril (PRINIVIL,ZESTRIL) 20 MG tablet Take 1 tablet (20 mg total) by mouth daily. 90 tablet 3  . pravastatin (PRAVACHOL) 40 MG tablet Take 1 tablet (40 mg total) by mouth daily. 90 tablet 3  . levofloxacin (LEVAQUIN) 500 MG tablet Take 1 tablet (500 mg total) by mouth daily. 10 tablet 0  . tamsulosin (FLOMAX) 0.4 MG CAPS capsule Take 1 capsule (0.4 mg total) by mouth daily after breakfast. 30 capsule 1   No current facility-administered medications for this visit.    Allergies  Allergen Reactions   . Simvastatin     myalgia       Objective:  BP 127/79   Pulse 61   Temp 97.7 F (36.5 C) (Oral)   Ht 5\' 9"  (1.753 m)   Wt 167 lb (75.8 kg)   BMI 24.66 kg/m  Gen:, not ill-appearing, no distress, appropriate for age 40: normal conjunctiva, wearing glasses, trachea midline Pulm: Normal work of breathing, normal phonation, clear to auscultation bilaterally CV: Normal rate, regular rhythm, s1 and s2 distinct, no murmurs, clicks or rubs  GI: abdomen soft, nondistended, there is suprapubic tenderness, umbilical hernia present Neuro: alert and oriented x 3, no tremor MSK: extremities atraumatic, normal gait and station Skin: warm, dry, intact; no rashes on exposed skin, no cyanosis    Results for orders placed or performed in visit on 10/11/16 (from the past 72 hour(s))  POCT Urinalysis Dipstick     Status: Abnormal   Collection Time: 10/11/16 10:39 AM  Result Value Ref Range   Color, UA yellow    Clarity, UA clear    Glucose, UA negative    Bilirubin, UA negative    Ketones, UA negative    Spec Grav, UA 1.020 1.010 - 1.025  Blood, UA small    pH, UA 6.0 5.0 - 8.0   Protein, UA negative    Urobilinogen, UA 0.2 0.2 or 1.0 E.U./dL   Nitrite, UA negative    Leukocytes, UA Negative Negative   No results found.    Assessment and Plan: 71 y.o. male with   1. Benign prostatic hyperplasia with weak urinary stream - POCT Urinalysis Dipstick positive for small blood - starting Flomax 0.4mg  QPS  2. Lower abdominal pain - treating empirically for prostatitis with Levaquin 500mg  daily for 10 days - Urine Culture pending - POCT Urinalysis Dipstick negative for leuks, nitrites - GC/Chlamydia Probe Amp pending   Patient education and anticipatory guidance given Patient agrees with treatment plan Follow-up in 2 weeks with PCP or sooner as needed  I spent 25 minutes with this patient, greater than 50% was face-to-face time counseling regarding the above  diagnoses  Darlyne Russian PA-C

## 2016-10-12 LAB — GC/CHLAMYDIA PROBE AMP
CT Probe RNA: NOT DETECTED
GC Probe RNA: NOT DETECTED

## 2016-10-12 LAB — URINE CULTURE: ORGANISM ID, BACTERIA: NO GROWTH

## 2016-10-13 DIAGNOSIS — Z888 Allergy status to other drugs, medicaments and biological substances status: Secondary | ICD-10-CM | POA: Diagnosis not present

## 2016-10-13 DIAGNOSIS — R300 Dysuria: Secondary | ICD-10-CM | POA: Diagnosis not present

## 2016-10-13 DIAGNOSIS — Z7982 Long term (current) use of aspirin: Secondary | ICD-10-CM | POA: Diagnosis not present

## 2016-10-13 DIAGNOSIS — N401 Enlarged prostate with lower urinary tract symptoms: Secondary | ICD-10-CM | POA: Diagnosis not present

## 2016-10-13 DIAGNOSIS — R338 Other retention of urine: Secondary | ICD-10-CM | POA: Diagnosis not present

## 2016-10-13 DIAGNOSIS — Z79899 Other long term (current) drug therapy: Secondary | ICD-10-CM | POA: Diagnosis not present

## 2016-10-13 DIAGNOSIS — F17200 Nicotine dependence, unspecified, uncomplicated: Secondary | ICD-10-CM | POA: Diagnosis not present

## 2016-10-13 DIAGNOSIS — R319 Hematuria, unspecified: Secondary | ICD-10-CM | POA: Diagnosis not present

## 2016-10-13 DIAGNOSIS — I2510 Atherosclerotic heart disease of native coronary artery without angina pectoris: Secondary | ICD-10-CM | POA: Diagnosis not present

## 2016-10-13 DIAGNOSIS — N400 Enlarged prostate without lower urinary tract symptoms: Secondary | ICD-10-CM | POA: Diagnosis not present

## 2016-10-13 DIAGNOSIS — E785 Hyperlipidemia, unspecified: Secondary | ICD-10-CM | POA: Diagnosis not present

## 2016-10-13 DIAGNOSIS — K429 Umbilical hernia without obstruction or gangrene: Secondary | ICD-10-CM | POA: Diagnosis not present

## 2016-10-13 DIAGNOSIS — I10 Essential (primary) hypertension: Secondary | ICD-10-CM | POA: Diagnosis not present

## 2016-10-13 DIAGNOSIS — R3914 Feeling of incomplete bladder emptying: Secondary | ICD-10-CM | POA: Diagnosis not present

## 2016-10-13 NOTE — Progress Notes (Signed)
Urine culture negative for infection Complete antibiotic therapy If symptoms persist, will refer to Urology

## 2016-10-19 DIAGNOSIS — N400 Enlarged prostate without lower urinary tract symptoms: Secondary | ICD-10-CM | POA: Diagnosis not present

## 2016-10-27 ENCOUNTER — Ambulatory Visit: Payer: Medicare HMO | Admitting: Osteopathic Medicine

## 2016-10-27 DIAGNOSIS — N400 Enlarged prostate without lower urinary tract symptoms: Secondary | ICD-10-CM | POA: Diagnosis not present

## 2017-01-03 ENCOUNTER — Encounter: Payer: Self-pay | Admitting: Physician Assistant

## 2017-01-03 ENCOUNTER — Ambulatory Visit: Payer: Medicare HMO | Admitting: Physician Assistant

## 2017-01-03 VITALS — BP 125/81 | HR 64 | Temp 98.1°F | Wt 171.3 lb

## 2017-01-03 DIAGNOSIS — R1032 Left lower quadrant pain: Principal | ICD-10-CM

## 2017-01-03 DIAGNOSIS — R1030 Lower abdominal pain, unspecified: Secondary | ICD-10-CM | POA: Diagnosis not present

## 2017-01-03 DIAGNOSIS — Z8719 Personal history of other diseases of the digestive system: Secondary | ICD-10-CM | POA: Diagnosis not present

## 2017-01-03 DIAGNOSIS — R1031 Right lower quadrant pain: Secondary | ICD-10-CM | POA: Insufficient documentation

## 2017-01-03 DIAGNOSIS — R3129 Other microscopic hematuria: Secondary | ICD-10-CM | POA: Diagnosis not present

## 2017-01-03 DIAGNOSIS — N410 Acute prostatitis: Secondary | ICD-10-CM | POA: Diagnosis not present

## 2017-01-03 DIAGNOSIS — Z9889 Other specified postprocedural states: Secondary | ICD-10-CM

## 2017-01-03 LAB — POCT URINALYSIS DIPSTICK
Bilirubin, UA: NEGATIVE
Glucose, UA: NEGATIVE
KETONES UA: NEGATIVE
LEUKOCYTES UA: NEGATIVE
Nitrite, UA: NEGATIVE
PH UA: 7 (ref 5.0–8.0)
PROTEIN UA: NEGATIVE
SPEC GRAV UA: 1.01 (ref 1.010–1.025)
UROBILINOGEN UA: 0.2 U/dL

## 2017-01-03 MED ORDER — CIPROFLOXACIN HCL 500 MG PO TABS: 500.0000 mg | ORAL_TABLET | Freq: Two times a day (BID) | ORAL | 0 refills | Status: AC

## 2017-01-03 NOTE — Progress Notes (Signed)
HPI:                                                                David Maldonado is a 71 y.o. male who presents to [facility] today for groin pain  Patient with PMH of BPH w/LUTS, HTN, PAD, colon polyps reports bilateral groin pain x 4-5 days. Pain is worse with standing. Pain is improved with sitting. He was doing work on his house involving lifting a ladder. Denies known injury or trauma. Pain is described as a dull discomfort. He reports history of a right inguinal hernia and umilical hernia s/p repair. Denies constitutional symptoms, abdominal pain, testicular pain or new urinary symptoms. He did have a urinary catheter 6 weeks ago for acute urinary retention. He is currently followed by Urology and taking Finasteride and Tamsulosin.   Past Medical History:  Diagnosis Date  . CHF (congestive heart failure) ([place])    Past Surgical History:  Procedure Laterality Date  . HERNIA REPAIR     Social History   Tobacco Use  . Smoking status: Current Every Day Smoker    Packs/day: 0.50    Types: Cigars  . Smokeless tobacco: Never Used  Substance Use Topics  . Alcohol use: Yes    Comment: 10 drinks a wk   family history is not on file.  ROS: negative except as noted in the HPI  Medications: Current Outpatient Medications  Medication Sig Dispense Refill  . aspirin 81 MG tablet Take 1 tablet (81 mg total) by mouth daily. 90 tablet 3  . finasteride (PROSCAR) 5 MG tablet Take 5 mg daily by mouth.    Marland Kitchen lisinopril (PRINIVIL,ZESTRIL) 20 MG tablet Take 1 tablet (20 mg total) by mouth daily. 90 tablet 3  . pravastatin (PRAVACHOL) 40 MG tablet Take 1 tablet (40 mg total) by mouth daily. 90 tablet 3  . tamsulosin (FLOMAX) 0.4 MG CAPS capsule Take 1 capsule (0.4 mg total) by mouth daily after breakfast. 30 capsule 1   No current facility-administered medications for this visit.    Allergies  Allergen Reactions  . Simvastatin      myalgia       Objective:  BP 125/81   Pulse 64   Temp 98.1 F (36.7 C)   Wt 171 lb 5 oz (77.7 kg)   SpO2 95%   BMI 25.30 kg/m  Physical Exam  Constitutional: Vital signs are normal. He is active. He does not appear ill. No distress.  HENT:  Head: Normocephalic and atraumatic.  Abdominal: Soft. Normal appearance. He exhibits no distension. There is no tenderness. A hernia (umbilical, small) is present. Hernia confirmed negative in the right inguinal area and confirmed negative in the left inguinal area.  Genitourinary: Testes normal and penis normal. Right testis shows no mass, no swelling and no tenderness. Left testis shows no mass, no swelling and no tenderness. Circumcised.  Genitourinary Comments: Internal exam deferred  Musculoskeletal:       Right hip: Normal. He exhibits normal range of motion, normal strength and no tenderness.       Left hip: Normal. He exhibits normal range of motion, normal strength and no tenderness.  Neurological: He is alert.   A chaperone was used for  the GU portion of the exam, Dr. Aundria Mems  No results found for this or any previous visit (from the past 72 hour(s)). No results found.    Assessment and Plan: 71 y.o. male with   1. Bilateral groin pain - POCT Urinalysis Dipstick positive for trace blood, otherwise negative. He has a history of microhematuria since 10/27/16 - Urine Culture pending - normal musculoskeletal exam. No evidence of inguinal hernia. Patient has risk factors for prostatitis including recent catheterization and BPH. Will treat empirically - ciprofloxacin (CIPRO) 500 MG tablet; Take 1 tablet (500 mg total) 2 (two) times daily for 14 days by mouth.  Dispense: 28 tablet; Refill: 0  2. Acute prostatitis - suspected given history of urinary retention and catheterization - ciprofloxacin (CIPRO) 500 MG tablet; Take 1 tablet (500 mg total) 2 (two) times daily for 14 days by mouth.  Dispense: 28 tablet; Refill:  0   3. History of right inguinal hernia repair - CT Abd/Pelvis 10/13/16 negative for inguinal hernia  4. Microhematuria - present on UA today. Followed by urology  Patient education and anticipatory guidance given Patient agrees with treatment plan Follow-up with PCP in 1 week or sooner as needed if symptoms worsen or fail to improve  Darlyne Russian PA-C

## 2017-01-03 NOTE — Patient Instructions (Signed)
Prostatitis Prostatitis is swelling or inflammation of the prostate gland. The prostate is a walnut-sized gland that is involved in the production of semen. It is located below a man's bladder, in front of the rectum. There are four types of prostatitis:  Chronic nonbacterial prostatitis. This is the most common type of prostatitis. It may be associated with a viral infection or autoimmune disorder.  Acute bacterial prostatitis. This is the least common type of prostatitis. It starts quickly and is usually associated with a bladder infection, high fever, and shaking chills. It can occur at any age.  Chronic bacterial prostatitis. This type usually results from acute bacterial prostatitis that happens repeatedly (is recurrent) or has not been treated properly. It can occur in men of any age but is most common among middle-aged men whose prostate has begun to get larger. The symptoms are not as severe as symptoms caused by acute bacterial prostatitis.  Prostatodynia or chronic pelvic pain syndrome (CPPS). This type is also called pelvic floor disorder. It is associated with increased muscular tone in the pelvis surrounding the prostate. What are the causes? Bacterial prostatitis is caused by infection from bacteria. Chronic nonbacterial prostatitis may be caused by:  Urinary tract infections (UTIs).  Nerve damage.  A response by the body's disease-fighting system (autoimmune response).  Chemicals in the urine. The causes of the other types of prostatitis are usually not known. What are the signs or symptoms? Symptoms of this condition vary depending upon the type of prostatitis. If you have acute bacterial prostatitis, you may experience:  Urinary symptoms, such as:  Painful urination.  Burning during urination.  Frequent and sudden urges to urinate.  Inability to start urinating.  A weak or interrupted stream of urine.  Vomiting.  Nausea.  Fever.  Chills.  Inability to  empty the bladder completely.  Pain in the:  Muscles or joints.  Lower back.  Lower abdomen. If you have any of the other types of prostatitis, you may experience:  Urinary symptoms, such as:  Sudden urges to urinate.  Frequent urination.  Difficulty starting urination.  Weak urine stream.  Dribbling after urination.  Discharge from the urethra. The urethra is a tube that opens at the end of the penis.  Pain in the:  Testicles.  Penis or tip of the penis.  Rectum.  Area in front of the rectum and below the scrotum (perineum).  Problems with sexual function.  Painful ejaculation.  Bloody semen. How is this diagnosed? This condition may be diagnosed based on:  A physical and medical exam.  Your symptoms.  A urine test to check for bacteria.  An exam in which a health care provider uses a finger to feel the prostate (digital rectal exam).  A test of a sample of semen.  Blood tests.  Ultrasound.  Removal of prostate tissue to be examined under a microscope (biopsy).  Tests to check how your body handles urine (urodynamic tests).  A test to look inside your bladder or urethra (cystoscopy). How is this treated? Treatment for this condition depends on the type of prostatitis. Treatment may involve:  Medicines to relieve pain or inflammation.  Medicines to help relax your muscles.  Physical therapy.  Heat therapy.  Techniques to help you control certain body functions (biofeedback).  Relaxation exercises.  Antibiotic medicine, if your condition is caused by bacteria.  Warm water baths (sitz baths). Sitz baths help with relaxing your pelvic floor muscles, which helps to relieve pressure on the prostate. Follow   these instructions at home:  Take over-the-counter and prescription medicines only as told by your health care provider.  If you were prescribed an antibiotic, take it as told by your health care provider. Do not stop taking the  antibiotic even if you start to feel better.  If physical therapy, biofeedback, or relaxation exercises were prescribed, do exercises as instructed.  Take sitz baths as directed by your health care provider. For a sitz bath, sit in warm water that is deep enough to cover your hips and buttocks.  Keep all follow-up visits as told by your health care provider. This is important. Contact a health care provider if:  Your symptoms get worse.  You have a fever. Get help right away if:  You have chills.  You feel nauseous.  You vomit.  You feel light-headed or feel like you are going to faint.  You are unable to urinate.  You have blood or blood clots in your urine. This information is not intended to replace advice given to you by your health care provider. Make sure you discuss any questions you have with your health care provider. Document Released: 02/13/2000 Document Revised: 11/06/2015 Document Reviewed: 11/06/2015 Elsevier Interactive Patient Education  2017 [employer]  

## 2017-01-04 LAB — URINE CULTURE
MICRO NUMBER: 81240242
RESULT: NO GROWTH
SPECIMEN QUALITY: ADEQUATE

## 2017-01-14 ENCOUNTER — Ambulatory Visit: Payer: Medicare HMO | Admitting: Osteopathic Medicine

## 2017-01-14 ENCOUNTER — Encounter: Payer: Self-pay | Admitting: Osteopathic Medicine

## 2017-01-14 VITALS — BP 129/83 | HR 67 | Temp 98.5°F | Ht 69.0 in | Wt 174.0 lb

## 2017-01-14 DIAGNOSIS — N410 Acute prostatitis: Secondary | ICD-10-CM

## 2017-01-14 NOTE — Progress Notes (Signed)
HPI: David Maldonado is a 71 y.o. male who  has a past medical history of CHF (congestive heart failure) ([place]).  he presents to [facility] today, 01/14/17,  for chief complaint of:  Chief Complaint  Patient presents with  . Follow-up    BLADDER CONCERNS     . Context: 11 days ago, saw colleague here in the office for chief complaint of groin pain for 4-5 days, worse with standing, better with sitting. Had urinary catheterization 6 weeks ago for a urinary retention, followed by urology, meds as below. Pain started after doing work on his house involving some lifting, no other trauma. UA in office positive for trace blood, no evidence of inguinal hernia on exam, given risk factors for prostatitis with BPH and recent catheterization treated with Cipro twice a day for 14 days. Patient was advised follow-up with PCP, myself. Urine culture was negative.  . Doing well today, all symptoms resolved.     Past medical, surgical, social and family history reviewed:  Patient Active Problem List   Diagnosis Date Noted  . History of right inguinal hernia repair 01/03/2017  . Bilateral groin pain 01/03/2017  . Microhematuria 01/03/2017  . Benign prostatic hyperplasia with weak urinary stream 10/11/2016  . Lower abdominal pain 10/11/2016  . Glucose intolerance 09/15/2016  . History of colonic polyps 05/20/2015  . Essential hypertension 07/01/2014  . Hyperlipidemia 07/01/2014  . Lumbago 07/01/2014  . Peripheral artery disease ([place]) 07/01/2014    Past Surgical History:  Procedure Laterality Date  . CYSTOSCOPY  2016  . HERNIA REPAIR     umbilical and R inguinal    Social History   Tobacco Use  . Smoking status: Current Every Day Smoker    Packs/day: 0.50    Types: Cigars  . Smokeless tobacco: Never Used  Substance Use Topics  . Alcohol use: Yes    Comment: 10 drinks a wk    No family history on file.   Current medication list and  allergy/intolerance information reviewed:    Current Outpatient Medications  Medication Sig Dispense Refill  . aspirin 81 MG tablet Take 1 tablet (81 mg total) by mouth daily. 90 tablet 3  . ciprofloxacin (CIPRO) 500 MG tablet Take 1 tablet (500 mg total) 2 (two) times daily for 14 days by mouth. 28 tablet 0  . finasteride (PROSCAR) 5 MG tablet Take 5 mg daily by mouth.    Marland Kitchen lisinopril (PRINIVIL,ZESTRIL) 20 MG tablet Take 1 tablet (20 mg total) by mouth daily. 90 tablet 3  . Omega-3 Fatty Acids (FISH OIL PO) Take daily by mouth.    . pravastatin (PRAVACHOL) 40 MG tablet Take 1 tablet (40 mg total) by mouth daily. 90 tablet 3  . tamsulosin (FLOMAX) 0.4 MG CAPS capsule Take 1 capsule (0.4 mg total) by mouth daily after breakfast. 30 capsule 1  . Zn-Pyg Afri-Nettle-Saw Palmet (SAW PALMETTO COMPLEX PO) Take daily by mouth.     No current facility-administered medications for this visit.     Allergies  Allergen Reactions  . Simvastatin     myalgia      Review of Systems:  Constitutional:  No  fever, no chills  Respiratory:  No  shortness of breath.   Gastrointestinal: No  abdominal pain, No  nausea, No  Vomiting  Genitourinary: No  incontinence, No  abnormal genital bleeding, No abnormal genital discharge   Exam:  BP 129/83   Pulse 67   Temp 98.5 F (  36.9 C) (Oral)   Ht 5\' 9"  (1.753 m)   Wt 174 lb (78.9 kg)   BMI 25.70 kg/m   Constitutional: VS see above. General Appearance: alert, well-developed, well-nourished, NAD   Neck: No masses, trachea midline.   Respiratory: Normal respiratory effort. no wheeze, no rhonchi, no rales  Cardiovascular: S1/S2 normal, no murmur, no rub/gallop auscultated. RRR. No lower extremity edema.   Musculoskeletal: Gait normal.    Recent Results (from the past 2160 hour(s))  Urine Culture     Status: None   Collection Time: 01/03/17 11:02 AM  Result Value Ref Range   MICRO NUMBER: [id number]    SPECIMEN QUALITY: ADEQUATE    Sample  Source URINE    STATUS: FINAL    Result: No Growth   POCT Urinalysis Dipstick     Status: Abnormal   Collection Time: 01/03/17 11:07 AM  Result Value Ref Range   Color, UA light yellow    Clarity, UA clear    Glucose, UA negative    Bilirubin, UA negative    Ketones, UA negative    Spec Grav, UA 1.010 1.010 - 1.025   Blood, UA trace    pH, UA 7.0 5.0 - 8.0   Protein, UA negative    Urobilinogen, UA 0.2 0.2 or 1.0 E.U./dL   Nitrite, UA negative    Leukocytes, UA Negative Negative     ASSESSMENT/PLAN:   Acute prostatitis - treated and resolved      Visit summary with medication list and pertinent instructions was printed for patient to review. All questions at time of visit were answered - patient instructed to contact office with any additional concerns. ER/RTC precautions were reviewed with the patient. Follow-up plan: Return if symptoms worsen, see me or urology. .  Note: Total time spent 10 minutes, greater than 50% of the visit was spent face-to-face counseling and coordinating care for the following: The encounter diagnosis was Acute prostatitis.Marland Kitchen  Please note: voice recognition software was used to produce this document, and typos may escape review. Please contact Dr. Sheppard Coil for any needed clarifications.

## 2017-02-01 DIAGNOSIS — N400 Enlarged prostate without lower urinary tract symptoms: Secondary | ICD-10-CM | POA: Diagnosis not present

## 2017-02-04 ENCOUNTER — Ambulatory Visit: Payer: Medicare HMO | Admitting: Osteopathic Medicine

## 2017-02-04 ENCOUNTER — Encounter: Payer: Self-pay | Admitting: Osteopathic Medicine

## 2017-02-04 ENCOUNTER — Ambulatory Visit (INDEPENDENT_AMBULATORY_CARE_PROVIDER_SITE_OTHER): Payer: Medicare HMO

## 2017-02-04 VITALS — BP 106/68 | HR 66 | Wt 172.0 lb

## 2017-02-04 DIAGNOSIS — M47812 Spondylosis without myelopathy or radiculopathy, cervical region: Secondary | ICD-10-CM | POA: Diagnosis not present

## 2017-02-04 DIAGNOSIS — G44209 Tension-type headache, unspecified, not intractable: Secondary | ICD-10-CM

## 2017-02-04 DIAGNOSIS — M5031 Other cervical disc degeneration,  high cervical region: Secondary | ICD-10-CM

## 2017-02-04 NOTE — Progress Notes (Signed)
HPI: David Maldonado is a 71 y.o. male who  has a past medical history of CHF (congestive heart failure) ([place]).  he presents to [facility] today, 02/04/17,  for chief complaint of:  Chief Complaint  Patient presents with  . Headache    Headache . Context: no headache history. (+)Hx neck arthritis . Location: back of head bilaterally, radiating forward symmetrically . Quality: dull . Severity: 1/10 today, better than it's been at about 5-6/10 last week . Duration: 7-10 days or so . Timing: constant . Modifying factors: tried Tylenol a few times which was helpful.  . Assoc signs/symptoms: no nausea, no photophobia, no vision changes, no focal pain, no dizziness, no fever, no rash     Past medical, surgical, social and family history reviewed:  Patient Active Problem List   Diagnosis Date Noted  . Acute prostatitis 01/14/2017  . History of right inguinal hernia repair 01/03/2017  . Bilateral groin pain 01/03/2017  . Microhematuria 01/03/2017  . Benign prostatic hyperplasia with weak urinary stream 10/11/2016  . Lower abdominal pain 10/11/2016  . Glucose intolerance 09/15/2016  . History of colonic polyps 05/20/2015  . Essential hypertension 07/01/2014  . Hyperlipidemia 07/01/2014  . Lumbago 07/01/2014  . Peripheral artery disease ([place]) 07/01/2014    Past Surgical History:  Procedure Laterality Date  . CYSTOSCOPY  2016  . HERNIA REPAIR     umbilical and R inguinal    Social History   Tobacco Use  . Smoking status: Current Every Day Smoker    Packs/day: 0.50    Types: Cigars  . Smokeless tobacco: Never Used  Substance Use Topics  . Alcohol use: Yes    Comment: 10 drinks a wk    No family history on file.   Current medication list and allergy/intolerance information reviewed:    Current Outpatient Medications  Medication Sig Dispense Refill  . aspirin 81 MG tablet Take 1 tablet (81 mg total) by mouth daily. 90  tablet 3  . finasteride (PROSCAR) 5 MG tablet Take 5 mg daily by mouth.    Marland Kitchen lisinopril (PRINIVIL,ZESTRIL) 20 MG tablet Take 1 tablet (20 mg total) by mouth daily. 90 tablet 3  . Omega-3 Fatty Acids (FISH OIL PO) Take daily by mouth.    . pravastatin (PRAVACHOL) 40 MG tablet Take 1 tablet (40 mg total) by mouth daily. 90 tablet 3  . tamsulosin (FLOMAX) 0.4 MG CAPS capsule Take 1 capsule (0.4 mg total) by mouth daily after breakfast. 30 capsule 1  . Zn-Pyg Afri-Nettle-Saw Palmet (SAW PALMETTO COMPLEX PO) Take daily by mouth.     No current facility-administered medications for this visit.     Allergies  Allergen Reactions  . Simvastatin     myalgia      Review of Systems:  Constitutional:  No  fever, no chills, No recent illness, No unintentional weight changes. No significant fatigue.   HEENT: +headache, no vision change, no hearing change, No sore throat, No  sinus pressure  Cardiac: No  chest pain, No  pressure, No palpitations  Respiratory:  No  shortness of breath. No  Cough  Gastrointestinal: No  abdominal pain, No  nausea  Musculoskeletal: No new myalgia/arthralgia, +occasional neck pain  Skin: No  Rash,  Endocrine: No cold intolerance,  No heat intolerance.   Neurologic: No  weakness, No  dizziness, No  slurred speech/focal weakness/facial droop  Psychiatric: No  concerns with depression, No  concerns with anxiety, No sleep problems,  No mood problems  Exam:  BP 106/68   Pulse 66   Wt 172 lb (78 kg)   BMI 25.40 kg/m   Constitutional: VS see above. General Appearance: alert, well-developed, well-nourished, NAD  Eyes: Normal lids and conjunctive, non-icteric sclera, EOMI, PERRLA, no nystagmus  Ears, Nose, Mouth, Throat: MMM, Normal external inspection ears/nares/mouth/lips/gums. TM normal bilaterally.   Neck: No masses, trachea midline. No thyroid enlargement. Normal ROM flex/xt, rotation L/R, sidebending on active ROM. Palpation, (+)TART changes near OA/C1/C2  muscle ropy texture   Respiratory: Normal respiratory effort. no wheeze, no rhonchi, no rales  Cardiovascular: S1/S2 normal, no murmur, no rub/gallop auscultated. RRR. No lower extremity edema.   Gastrointestinal: Nontender, no masses  Musculoskeletal: Gait normal. No clubbing/cyanosis of digits. Strength 5/5 all extremities and equal   Neurological: Normal balance/coordination. No tremor. No cranial nerve deficit on limited exam. Motor and sensation intact and symmetric. Cerebellar reflexes intact.   Skin: warm, dry, intact. No rash/ulcer.    Psychiatric: Normal judgment/insight. Normal mood and affect. Oriented x3.    Dg Cervical Spine 2 Or 3 Views  Result Date: 02/04/2017 CLINICAL DATA:  71 year old male with headache and neck pain for 7-10 days. EXAM: CERVICAL SPINE - 2-3 VIEW COMPARISON:  None. FINDINGS: Bone mineralization is within normal limits. Relatively preserved cervical lordosis. There is subtle retrolisthesis of C3 on C4, anterolisthesis of C2 on C3 and C7 on T1. Normal prevertebral soft tissue contour. Disc space loss and degenerative endplate spurring at Y4-I3, C6-C7. Lesser disc and endplate degeneration at C4-C5. Mild levoconvex curvature of the cervical spine with otherwise normal AP alignment. C1-C2 alignment and joint spaces are normal. Negative lung apices. Bilateral calcified carotid atherosclerosis in the neck. IMPRESSION: 1.  No acute osseous abnormality identified in the cervical spine. 2. Multilevel mild cervical spondylolisthesis. Advanced chronic disc and endplate degeneration at C3-C4 and C6-C7. Electronically Signed   By: Genevie Ann M.D.   On: 02/04/2017 12:07     ASSESSMENT/PLAN:   Tension-type headache, not intractable, unspecified chronicity pattern - New headache >65 warrants imaging, but pt declines for now after feeling improvement with OMT treatment. Intracranial seems unlikely but caution - patient advised of risks vs benefits and advised of stroke  precautions or other reasons to seek emergency care - Plan: DG Cervical Spine 2 or 3 views    Patient Instructions  Aspirin-acetaminophen-caffeine OTC     Visit summary with medication list and pertinent instructions was printed for patient to review. All questions at time of visit were answered - patient instructed to contact office with any additional concerns. ER/RTC precautions were reviewed with the patient.   Follow-up plan: Return if symptoms worsen or fail to improve - call me! .  Note: Total time spent 25 minutes, greater than 50% of the visit was spent face-to-face counseling and coordinating care for the following: The encounter diagnosis was Tension-type headache, not intractable, unspecified chronicity pattern..  Please note: voice recognition software was used to produce this document, and typos may escape review. Please contact Dr. Sheppard Coil for any needed clarifications.

## 2017-02-04 NOTE — Patient Instructions (Signed)
Aspirin-acetaminophen-caffeine OTC

## 2017-02-06 ENCOUNTER — Encounter: Payer: Self-pay | Admitting: Osteopathic Medicine

## 2017-02-06 DIAGNOSIS — G44209 Tension-type headache, unspecified, not intractable: Secondary | ICD-10-CM | POA: Insufficient documentation

## 2017-08-02 ENCOUNTER — Other Ambulatory Visit: Payer: Self-pay | Admitting: Osteopathic Medicine

## 2017-08-02 DIAGNOSIS — I10 Essential (primary) hypertension: Secondary | ICD-10-CM

## 2017-08-02 DIAGNOSIS — E782 Mixed hyperlipidemia: Secondary | ICD-10-CM

## 2017-08-19 ENCOUNTER — Other Ambulatory Visit: Payer: Self-pay

## 2017-08-19 ENCOUNTER — Other Ambulatory Visit: Payer: Self-pay | Admitting: Physician Assistant

## 2017-08-19 DIAGNOSIS — I10 Essential (primary) hypertension: Secondary | ICD-10-CM

## 2017-08-19 DIAGNOSIS — E782 Mixed hyperlipidemia: Secondary | ICD-10-CM

## 2017-08-19 MED ORDER — PRAVASTATIN SODIUM 40 MG PO TABS: 40.0000 mg | ORAL_TABLET | Freq: Every day | ORAL | 0 refills | Status: DC

## 2017-08-19 MED ORDER — LISINOPRIL 20 MG PO TABS: 20.0000 mg | ORAL_TABLET | Freq: Every day | ORAL | 0 refills | Status: DC

## 2017-09-14 DIAGNOSIS — N400 Enlarged prostate without lower urinary tract symptoms: Secondary | ICD-10-CM | POA: Diagnosis not present

## 2017-10-05 ENCOUNTER — Ambulatory Visit: Payer: Medicare HMO | Admitting: Osteopathic Medicine

## 2017-10-24 ENCOUNTER — Other Ambulatory Visit: Payer: Self-pay | Admitting: Osteopathic Medicine

## 2017-10-24 DIAGNOSIS — E782 Mixed hyperlipidemia: Secondary | ICD-10-CM

## 2017-10-24 DIAGNOSIS — I10 Essential (primary) hypertension: Secondary | ICD-10-CM

## 2017-11-14 NOTE — Progress Notes (Signed)
Subjective

## 2017-11-21 ENCOUNTER — Ambulatory Visit (INDEPENDENT_AMBULATORY_CARE_PROVIDER_SITE_OTHER): Payer: Medicare HMO | Admitting: *Deleted

## 2017-11-21 VITALS — BP 113/78 | HR 78 | Ht 69.0 in | Wt 170.0 lb

## 2017-11-21 DIAGNOSIS — Z0000 Encounter for general adult medical examination without abnormal findings: Secondary | ICD-10-CM | POA: Diagnosis not present

## 2017-11-21 NOTE — Patient Instructions (Addendum)
Please schedule your next medicare wellness visit with me in 1 yr.  David Maldonado , Thank you for taking time to come for your Medicare Wellness Visit. I appreciate your ongoing commitment to your health goals. Please review the following plan we discussed and let me know if I can assist you in the future.   These are the goals we discussed: Goals   None     This is a list of the screening recommended for you and due dates:  Health Maintenance  Continue doing brain stimulating activities (puzzles, reading, adult coloring books, staying active) to keep memory sharp.   DASH Eating Plan DASH stands for "Dietary Approaches to Stop Hypertension." The DASH eating plan is a healthy eating plan that has been shown to reduce high blood pressure (hypertension). It may also reduce your risk for type 2 diabetes, heart disease, and stroke. The DASH eating plan may also help with weight loss. What are tips for following this plan? General guidelines  Avoid eating more than 2,300 mg (milligrams) of salt (sodium) a day. If you have hypertension, you may need to reduce your sodium intake to 1,500 mg a day.  Limit alcohol intake to no more than 1 drink a day for nonpregnant women and 2 drinks a day for men. One drink equals 12 oz of beer, 5 oz of wine, or 1 oz of hard liquor.  Work with your health care provider to maintain a healthy body weight or to lose weight. Ask what an ideal weight is for you.  Get at least 30 minutes of exercise that causes your heart to beat faster (aerobic exercise) most days of the week. Activities may include walking, swimming, or biking.  Work with your health care provider or diet and nutrition specialist (dietitian) to adjust your eating plan to your individual calorie needs. Reading food labels  Check food labels for the amount of sodium per serving. Choose foods with less than 5 percent of the Daily Value of sodium. Generally, foods with less than 300 mg of sodium per  serving fit into this eating plan.  To find whole grains, look for the word "whole" as the first word in the ingredient list. Shopping  Buy products labeled as "low-sodium" or "no salt added."  Buy fresh foods. Avoid canned foods and premade or frozen meals. Cooking  Avoid adding salt when cooking. Use salt-free seasonings or herbs instead of table salt or sea salt. Check with your health care provider or pharmacist before using salt substitutes.  Do not fry foods. Cook foods using healthy methods such as baking, boiling, grilling, and broiling instead.  Cook with heart-healthy oils, such as olive, canola, soybean, or sunflower oil. Meal planning   Eat a balanced diet that includes: ? 5 or more servings of fruits and vegetables each day. At each meal, try to fill half of your plate with fruits and vegetables. ? Up to 6-8 servings of whole grains each day. ? Less than 6 oz of lean meat, poultry, or fish each day. A 3-oz serving of meat is about the same size as a deck of cards. One egg equals 1 oz. ? 2 servings of low-fat dairy each day. ? A serving of nuts, seeds, or beans 5 times each week. ? Heart-healthy fats. Healthy fats called Omega-3 fatty acids are found in foods such as flaxseeds and coldwater fish, like sardines, salmon, and mackerel.  Limit how much you eat of the following: ? Canned or prepackaged foods. ?  Food that is high in trans fat, such as fried foods. ? Food that is high in saturated fat, such as fatty meat. ? Sweets, desserts, sugary drinks, and other foods with added sugar. ? Full-fat dairy products.  Do not salt foods before eating.  Try to eat at least 2 vegetarian meals each week.  Eat more home-cooked food and less restaurant, buffet, and fast food.  When eating at a restaurant, ask that your food be prepared with less salt or no salt, if possible. What foods are recommended? The items listed may not be a complete list. Talk with your dietitian about  what dietary choices are best for you. Grains Whole-grain or whole-wheat bread. Whole-grain or whole-wheat pasta. Brown rice. [place]. Bulgur. Whole-grain and low-sodium cereals. Pita bread. Low-fat, low-sodium crackers. Whole-wheat flour tortillas. Vegetables Fresh or frozen vegetables (raw, steamed, roasted, or grilled). Low-sodium or reduced-sodium tomato and vegetable juice. Low-sodium or reduced-sodium tomato sauce and tomato paste. Low-sodium or reduced-sodium canned vegetables. Fruits All fresh, dried, or frozen fruit. Canned fruit in natural juice (without added sugar). Meat and other protein foods Skinless chicken or [place]. Ground chicken or [place]. Pork with fat trimmed off. Fish and seafood. Egg whites. Dried beans, peas, or lentils. Unsalted nuts, nut butters, and seeds. Unsalted canned beans. Lean cuts of beef with fat trimmed off. Low-sodium, lean deli meat. Dairy Low-fat (1%) or fat-free (skim) milk. Fat-free, low-fat, or reduced-fat cheeses. Nonfat, low-sodium ricotta or cottage cheese. Low-fat or nonfat yogurt. Low-fat, low-sodium cheese. Fats and oils Soft margarine without trans fats. Vegetable oil. Low-fat, reduced-fat, or light mayonnaise and salad dressings (reduced-sodium). Canola, safflower, olive, soybean, and sunflower oils. Avocado. Seasoning and other foods Herbs. Spices. Seasoning mixes without salt. Unsalted popcorn and pretzels. Fat-free sweets. What foods are not recommended? The items listed may not be a complete list. Talk with your dietitian about what dietary choices are best for you. Grains Baked goods made with fat, such as croissants, muffins, or some breads. Dry pasta or rice meal packs. Vegetables Creamed or fried vegetables. Vegetables in a cheese sauce. Regular canned vegetables (not low-sodium or reduced-sodium). Regular canned tomato sauce and paste (not low-sodium or reduced-sodium). Regular tomato and vegetable juice (not low-sodium or  reduced-sodium). Angie Fava. Olives. Fruits Canned fruit in a light or heavy syrup. Fried fruit. Fruit in cream or butter sauce. Meat and other protein foods Fatty cuts of meat. Ribs. Fried meat. Berniece Salines. Sausage. Bologna and other processed lunch meats. Salami. Fatback. Hotdogs. Bratwurst. Salted nuts and seeds. Canned beans with added salt. Canned or smoked fish. Whole eggs or egg yolks. Chicken or [place] with skin. Dairy Whole or 2% milk, cream, and half-and-half. Whole or full-fat cream cheese. Whole-fat or sweetened yogurt. Full-fat cheese. Nondairy creamers. Whipped toppings. Processed cheese and cheese spreads. Fats and oils Butter. Stick margarine. Lard. Shortening. Ghee. Bacon fat. Tropical oils, such as coconut, palm kernel, or palm oil. Seasoning and other foods Salted popcorn and pretzels. Onion salt, garlic salt, seasoned salt, table salt, and sea salt. Worcestershire sauce. Tartar sauce. Barbecue sauce. Teriyaki sauce. Soy sauce, including reduced-sodium. Steak sauce. Canned and packaged gravies. Fish sauce. Oyster sauce. Cocktail sauce. Horseradish that you find on the shelf. Ketchup. Mustard. Meat flavorings and tenderizers. Bouillon cubes. Hot sauce and Tabasco sauce. Premade or packaged marinades. Premade or packaged taco seasonings. Relishes. Regular salad dressings. Where to find more information:  National Heart, Lung, and [place]: https://wilson-eaton.com/  American Heart Association: www.heart.org Summary  The DASH eating plan is  a healthy eating plan that has been shown to reduce high blood pressure (hypertension). It may also reduce your risk for type 2 diabetes, heart disease, and stroke.  With the DASH eating plan, you should limit salt (sodium) intake to 2,300 mg a day. If you have hypertension, you may need to reduce your sodium intake to 1,500 mg a day.  When on the DASH eating plan, aim to eat more fresh fruits and vegetables, whole grains, lean proteins, low-fat  dairy, and heart-healthy fats.  Work with your health care provider or diet and nutrition specialist (dietitian) to adjust your eating plan to your individual calorie needs. This information is not intended to replace advice given to you by your health care provider. Make sure you discuss any questions you have with your health care provider. Document Released: 02/04/2011 Document Revised: 02/09/2016 Document Reviewed: 02/09/2016 Elsevier Interactive Patient Education  [employer].

## 2017-11-21 NOTE — Progress Notes (Signed)
Subjective:   David Maldonado is a 72 y.o. male who presents for Medicare Annual/Subsequent preventive examination.  Review of Systems:  No ROS.  Medicare Wellness Visit. Additional risk factors are reflected in the social history.  Cardiac Risk Factors include: dyslipidemia;hypertension;smoking/ tobacco exposure Sleep patterns: Average 7 hours a night. Wakes up 2 times a night to urinate. Home Safety/Smoke Alarms: Feels safe in home. Smoke alarms in place.  Living environment; Lives with wife in 1 story home. Steps have hand rails on them. Walk in shower no grabs bars in place. Drinks plenty of water daily. Watches his salt intake to help his blood pressure.   Male:   CCS-  utd   PSA- utd Lab Results  Component Value Date   PSA 1.120 04/07/2016       Objective:    Vitals: There were no vitals taken for this visit.  There is no height or weight on file to calculate BMI.  Advanced Directives 11/21/2017  Does Patient Have a Medical Advance Directive? Yes  Type of Paramedic of [place];Living will  Does patient want to make changes to medical advance directive? No - Patient declined  Copy of [place] in Chart? No - copy requested    Tobacco Social History   Tobacco Use  Smoking Status Current Every Day Smoker  . Packs/day: 0.50  . Types: Cigars  Smokeless Tobacco Never Used     Ready to quit: Not Answered Counseling given: Not Answered   Clinical Intake:  Pre-visit preparation completed: Yes  Pain : No/denies pain     Nutritional Risks: None Diabetes: No  How often do you need to have someone help you when you read instructions, pamphlets, or other written materials from your doctor or pharmacy?: 1 - Never What is the last grade level you completed in school?: 12th and 2 years of college  Interpreter Needed?: No     Past Medical History:  Diagnosis Date  . CHF (congestive heart failure) ([place])    Past  Surgical History:  Procedure Laterality Date  . CYSTOSCOPY  2016  . HERNIA REPAIR     umbilical and R inguinal   History reviewed. No pertinent family history. Social History   Socioeconomic History  . Marital status: Married    Spouse name: David Maldonado  . Number of children: 2  . Years of education: 57  . Highest education level: 12th grade  Occupational History  . 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 Financial resource strain: Not hard at all  . Food insecurity:    Worry: Never true    Inability: Never true  . Transportation needs:    Medical: No    Non-medical: No  Tobacco Use  . Smoking status: Current Every Day Smoker    Packs/day: 0.50    Types: Cigars  . Smokeless tobacco: Never Used  Substance and Sexual Activity  . Alcohol use: Yes    Comment: 12 drinks a wk  . Drug use: No  . Sexual activity: Never  Lifestyle  . Physical activity:    Days per week: 3 days    Minutes per session: 60 min  . Stress: Not at all  Relationships  . Social connections:    Talks on phone: Never    Gets together: Once a week    Attends religious service: Never    Active member of club or organization: Yes    Attends meetings  of clubs or organizations: 1 to 4 times per year    Relationship status: Married  Other Topics Concern  . Not on file  Social History Narrative   Plays golf during the week for 4 hours and walks the golf course.    Outpatient Encounter Medications as of 11/21/2017  Medication Sig  . aspirin 81 MG tablet Take 1 tablet (81 mg total) by mouth daily.  . finasteride (PROSCAR) 5 MG tablet Take 5 mg daily by mouth.  Marland Kitchen glucosamine-chondroitin 500-400 MG tablet Take 1 tablet by mouth daily.  Marland Kitchen lisinopril (PRINIVIL,ZESTRIL) 20 MG tablet Take 1 tablet (20 mg total) by mouth daily. Pt needs f/u appt w/PCP for refills.  . Omega-3 Fatty Acids (FISH OIL PO) Take daily by mouth.  . pravastatin (PRAVACHOL) 40 MG tablet Take 1 tablet (40 mg total) by  mouth daily. Pt needs f/u appt w/PCP for refills.  . tamsulosin (FLOMAX) 0.4 MG CAPS capsule Take 1 capsule (0.4 mg total) by mouth daily after breakfast.  . Zn-Pyg Afri-Nettle-Saw Palmet (SAW PALMETTO COMPLEX PO) Take daily by mouth.   No facility-administered encounter medications on file as of 11/21/2017.     Activities of Daily Living In your present state of health, do you have any difficulty performing the following activities: 11/21/2017  Hearing? N  Vision? N  Comment wears prescription glasses  Difficulty concentrating or making decisions? N  Walking or climbing stairs? N  Dressing or bathing? N  Doing errands, shopping? N  Preparing Food and eating ? N  Using the Toilet? N  In the past six months, have you accidently leaked urine? N  Do you have problems with loss of bowel control? N  Managing your Medications? N  Managing your Finances? N  Housekeeping or managing your Housekeeping? N  Some recent data might be hidden    Patient Care Team: Emeterio Reeve, DO as PCP - General (Osteopathic Medicine)   Assessment:   This is a routine wellness examination for [name]. Physical assessment deferred to PCP.   Exercise Activities and Dietary recommendations Current Exercise Habits: Structured exercise class, Type of exercise: Other - see comments, Time (Minutes): 30, Frequency (Times/Week): 4, Weekly Exercise (Minutes/Week): 120, Intensity: Mild, Exercise limited by: None identified Diet eats fruits and vegetables daily. Healthy diet Breakfast:coffee and toast. Lunch: sandwich, tuna salad Dinner: meat and vegetables       Goals   None     Fall Risk Fall Risk  11/21/2017 09/15/2016  Falls in the past year? No No   Is the patient's home free of loose throw rugs in walkways, pet beds, electrical cords, etc?   yes      Grab bars in the bathroom? no      Handrails on the stairs?   yes      Adequate lighting?   yes  Depression Screen PHQ 2/9 Scores 11/21/2017 09/15/2016   PHQ - 2 Score 0 0    Cognitive Function     6CIT Screen 09/15/2016  What Year? 0 points  What month? 0 points  What time? 0 points  Count back from 20 0 points  Months in reverse 0 points  Repeat phrase 0 points  Total Score 0    Immunization History  Administered Date(s) Administered  . Tdap 03/01/2009    Screening Tests Health Maintenance  Topic Date Due  . Hepatitis C Screening  April 22, 1945  . INFLUENZA VACCINE  09/29/2017  . PNA vac Low Risk Adult (1 of 2 -  PCV13) 10/01/2034 (Originally 05/09/2012)  . TETANUS/TDAP  03/02/2019  . COLONOSCOPY  05/19/2025      Plan:  Please schedule your next medicare wellness visit with me in 1 yr.  Mr. Mcguiness , Thank you for taking time to come for your Medicare Wellness Visit. I appreciate your ongoing commitment to your health goals. Please review the following plan we discussed and let me know if I can assist you in the future.   These are the goals we discussed: Goals   None     This is a list of the screening recommended for you and due dates:  Health Maintenance  Topic Date Due  .  Hepatitis C: One time screening is recommended by Center for Disease Control  (CDC) for  adults born from [birth year] through 1963.   May 21, 1945  . Flu Shot  09/29/2017  . Pneumonia vaccines (1 of 2 - PCV13) 10/01/2034*  . Tetanus Vaccine  03/02/2019  . Colon Cancer Screening  05/19/2025  *Topic was postponed. The date shown is not the original due date.   Bring a copy of your living will and/or healthcare power of attorney to your next office visit.   I have personally reviewed and noted the following in the patient's chart:   . Medical and social history . Use of alcohol, tobacco or illicit drugs  . Current medications and supplements . Functional ability and status . Nutritional status . Physical activity . Advanced directives . List of other physicians . Hospitalizations, surgeries, and ER visits in previous 12  months . Vitals . Screenings to include cognitive, depression, and falls . Referrals and appointments  In addition, I have reviewed and discussed with patient certain preventive protocols, quality metrics, and best practice recommendations. A written personalized care plan for preventive services as well as general preventive health recommendations were provided to patient.     Joanne Chars, LPN  [id number]

## 2017-11-23 ENCOUNTER — Ambulatory Visit: Payer: Medicare HMO | Admitting: Osteopathic Medicine

## 2017-12-21 ENCOUNTER — Encounter: Payer: Self-pay | Admitting: Osteopathic Medicine

## 2017-12-21 ENCOUNTER — Ambulatory Visit (INDEPENDENT_AMBULATORY_CARE_PROVIDER_SITE_OTHER): Payer: Medicare HMO | Admitting: Osteopathic Medicine

## 2017-12-21 VITALS — BP 119/68 | HR 55 | Temp 97.9°F | Wt 174.9 lb

## 2017-12-21 DIAGNOSIS — G8929 Other chronic pain: Secondary | ICD-10-CM

## 2017-12-21 DIAGNOSIS — I10 Essential (primary) hypertension: Secondary | ICD-10-CM | POA: Diagnosis not present

## 2017-12-21 DIAGNOSIS — M25512 Pain in left shoulder: Secondary | ICD-10-CM

## 2017-12-21 DIAGNOSIS — I110 Hypertensive heart disease with heart failure: Secondary | ICD-10-CM | POA: Diagnosis not present

## 2017-12-21 DIAGNOSIS — Z23 Encounter for immunization: Secondary | ICD-10-CM | POA: Diagnosis not present

## 2017-12-21 DIAGNOSIS — E782 Mixed hyperlipidemia: Secondary | ICD-10-CM | POA: Diagnosis not present

## 2017-12-21 DIAGNOSIS — R7301 Impaired fasting glucose: Secondary | ICD-10-CM | POA: Diagnosis not present

## 2017-12-21 DIAGNOSIS — I509 Heart failure, unspecified: Secondary | ICD-10-CM | POA: Diagnosis not present

## 2017-12-21 MED ORDER — PRAVASTATIN SODIUM 40 MG PO TABS: 40.0000 mg | ORAL_TABLET | Freq: Every day | ORAL | 3 refills | Status: DC

## 2017-12-21 MED ORDER — LISINOPRIL 20 MG PO TABS: 20.0000 mg | ORAL_TABLET | Freq: Every day | ORAL | 3 refills | Status: DC

## 2017-12-21 NOTE — Progress Notes (Signed)
HPI: David Maldonado is a 72 y.o. male who  has a past medical history of CHF (congestive heart failure) ([place]).  he presents to [facility] today, 12/21/17,  for chief complaint of: Med refills - HTN, HLD Shoulder pain   Hypertension: No chest pain, pressure, shortness of breath.  Blood pressure under good control on current medications, requests refill.  Hyperlipidemia: Fairly compliant with low-fat diet, golfs for exercise.  Doing well on current medications, requests refill  Left shoulder pain: Pain with abduction left shoulder, sometimes interferes with his golf swing but usually he does okay.  Ongoing for about 2 years.  Previously discussed with orthopedic surgeon, he declined any surgical intervention and is not willing at this time to get injections.    Past medical, surgical, social and family history reviewed:  Patient Active Problem List   Diagnosis Date Noted  . Tension-type headache, not intractable 02/06/2017  . Acute prostatitis 01/14/2017  . History of right inguinal hernia repair 01/03/2017  . Bilateral groin pain 01/03/2017  . Microhematuria 01/03/2017  . Benign prostatic hyperplasia with weak urinary stream 10/11/2016  . Lower abdominal pain 10/11/2016  . Glucose intolerance 09/15/2016  . History of colonic polyps 05/20/2015  . Essential hypertension 07/01/2014  . Hyperlipidemia 07/01/2014  . Lumbago 07/01/2014  . Peripheral artery disease ([place]) 07/01/2014    Past Surgical History:  Procedure Laterality Date  . CYSTOSCOPY  2016  . HERNIA REPAIR     umbilical and R inguinal    Social History   Tobacco Use  . Smoking status: Current Every Day Smoker    Packs/day: 0.50    Types: Cigars  . Smokeless tobacco: Never Used  Substance Use Topics  . Alcohol use: Yes    Comment: 12 drinks a wk    No family history on file.   Current medication list and allergy/intolerance information reviewed:    Current Outpatient  Medications  Medication Sig Dispense Refill  . aspirin 81 MG tablet Take 1 tablet (81 mg total) by mouth daily. 90 tablet 3  . finasteride (PROSCAR) 5 MG tablet Take 5 mg daily by mouth.    Marland Kitchen glucosamine-chondroitin 500-400 MG tablet Take 1 tablet by mouth daily.    Marland Kitchen lisinopril (PRINIVIL,ZESTRIL) 20 MG tablet Take 1 tablet (20 mg total) by mouth daily. Pt needs f/u appt w/PCP for refills. 90 tablet 0  . Omega-3 Fatty Acids (FISH OIL PO) Take daily by mouth.    . pravastatin (PRAVACHOL) 40 MG tablet Take 1 tablet (40 mg total) by mouth daily. Pt needs f/u appt w/PCP for refills. 90 tablet 0  . tamsulosin (FLOMAX) 0.4 MG CAPS capsule Take 1 capsule (0.4 mg total) by mouth daily after breakfast. 30 capsule 1  . Zn-Pyg Afri-Nettle-Saw Palmet (SAW PALMETTO COMPLEX PO) Take daily by mouth.     No current facility-administered medications for this visit.     Allergies  Allergen Reactions  . Simvastatin Other (See Comments)    myalgia myalgia      Review of Systems:  Constitutional:  No  fever, no chills, No recent illness  HEENT: No  headache, no vision change  Cardiac: No  chest pain, No  pressure, No palpitations, No  Orthopnea  Respiratory:  No  shortness of breath. No  Cough  Gastrointestinal: No  abdominal pain, No  nausea  Musculoskeletal: +myalgia/arthralgia  Skin: No  Rash, No other wounds/concerning lesions   Psychiatric: No  concerns with depression, No  concerns  with anxiety  Exam:  BP 119/68 (BP Location: Left Arm, Patient Position: Sitting, Cuff Size: Normal)   Pulse (!) 55   Temp 97.9 F (36.6 C) (Oral)   Wt 174 lb 14.4 oz (79.3 kg)   BMI 25.83 kg/m   Constitutional: VS see above. General Appearance: alert, well-developed, well-nourished, NAD  Eyes: Normal lids and conjunctive, non-icteric sclera  Ears, Nose, Mouth, Throat: MMM, Normal external inspection ears/nares/mouth/lips/gums.   Neck: No masses, trachea midline. No thyroid enlargement. No  tenderness/mass appreciated. No lymphadenopathy  Respiratory: Normal respiratory effort. no wheeze, no rhonchi, no rales  Cardiovascular: S1/S2 normal, no murmur, no rub/gallop auscultated. RRR. No lower extremity edema.  Gastrointestinal: Nontender, no masses  Musculoskeletal: Gait normal. No clubbing/cyanosis of digits.  left Shoulder Exam: (+)crepitus on abduction, crossover ok, apprehension ok, drop arm ok, apley scratch test ok  Neurological: Normal balance/coordination. No tremor. No cranial nerve deficit on limited exam.  Skin: warm, dry, intact. No rash/ulcer. No concerning nevi or subq nodules on limited exam.    Psychiatric: Normal judgment/insight. Normal mood and affect. Oriented x3.      ASSESSMENT/PLAN:   Chronic left shoulder pain - Advised home exercises, follow-up with sports medicine if needed. - Plan: CBC, COMPLETE METABOLIC PANEL WITH GFR, Lipid panel  Essential hypertension - Plan: lisinopril (PRINIVIL,ZESTRIL) 20 MG tablet, CBC, COMPLETE METABOLIC PANEL WITH GFR, Lipid panel  Mixed hyperlipidemia - Plan: pravastatin (PRAVACHOL) 40 MG tablet, CBC, COMPLETE METABOLIC PANEL WITH GFR, Lipid panel  Flu vaccine need - Patient states he will get this at the pharmacy  Need for pneumococcal vaccination - Denies this today but if he would rather space it out from flu shot, I would recommend getting flu shot first and foremost       Visit summary with medication list and pertinent instructions was printed for patient to review. All questions at time of visit were answered - patient instructed to contact office with any additional concerns. ER/RTC precautions were reviewed with the patient.   Follow-up plan: Return in about 6 months (around 06/22/2018) for monitor blood pressure (nurse visit ok), see me sooner if needed, recheck a/ Dr A 1 year for annual .    Please note: voice recognition software was used to produce this document, and typos may escape review.  Please contact Dr. Sheppard Coil for any needed clarifications.

## 2017-12-21 NOTE — Patient Instructions (Addendum)
General Preventive Care  Most recent routine screening lipids/other labs: ordered today. Cholesterol and Diabetes screening usually recommended annually.   Everyone should have blood pressure checked once per year.   Tobacco: don't! Alcohol: responsible moderation is ok for most adults - if you have concerns about your alcohol intake, please talk to me! Recreational/Illicit Drugs: don't!  Exercise: as tolerated to reduce risk of cardiovascular disease and diabetes. Strength training will also prevent osteoporosis.   Mental health: if need for mental health care (medicines, counseling, other), or concerns about moods, please let me know!   Sexual health: if need for STD testing, or if concerns with libido/pain problems, please let me know! Vaccines  Flu vaccine: recommended for almost everyone, every fall (by Halloween! Flu is scary!)  Shingles vaccine: Shingrix recommended after age 1   Pneumonia vaccines: Prevnar and Pneumovax recommended after age 71, sooner if diabetes, COPD/asthma, others  Tetanus booster: Tdap recommended every 10 years Cancer screenings   Colon cancer screening: recommended routine testing age 42-75  Prostate cancer screening: you're up to date Infection screenings . HIV: recommended screening at least once age 77-65 . Gonorrhea/Chlamydia: screening as needed . Hepatitis C: recommended once for anyone born 20-1963 . TB: certain at-risk populations, or depending on work requirements and/or travel history Other . Bone Density Test: recommended for men at age 79, sooner depending on risk factors . Advanced Directive: Living Will and/or Healthcare Power of Attorney recommended for all adults, regardless of age or health!

## 2017-12-24 LAB — COMPLETE METABOLIC PANEL WITH GFR
AG Ratio: 1.4 (calc) (ref 1.0–2.5)
ALKALINE PHOSPHATASE (APISO): 50 U/L (ref 40–115)
ALT: 17 U/L (ref 9–46)
AST: 22 U/L (ref 10–35)
Albumin: 4.3 g/dL (ref 3.6–5.1)
BUN: 17 mg/dL (ref 7–25)
CALCIUM: 9.4 mg/dL (ref 8.6–10.3)
CO2: 27 mmol/L (ref 20–32)
CREATININE: 1.02 mg/dL (ref 0.70–1.18)
Chloride: 103 mmol/L (ref 98–110)
GFR, Est African American: 86 mL/min/{1.73_m2} (ref >=60)
GFR, Est Non African American: 74 mL/min/{1.73_m2} (ref >=60)
GLUCOSE: 109 mg/dL — AB (ref 65–99)
Globulin: 3 g/dL (calc) (ref 1.9–3.7)
Potassium: 4.7 mmol/L (ref 3.5–5.3)
Sodium: 137 mmol/L (ref 135–146)
Total Bilirubin: 0.5 mg/dL (ref 0.2–1.2)
Total Protein: 7.3 g/dL (ref 6.1–8.1)

## 2017-12-24 LAB — TEST AUTHORIZATION

## 2017-12-24 LAB — CBC
HEMATOCRIT: 42.7 % (ref 38.5–50.0)
Hemoglobin: 14.3 g/dL (ref 13.2–17.1)
MCH: 31 pg (ref 27.0–33.0)
MCHC: 33.5 g/dL (ref 32.0–36.0)
MCV: 92.6 fL (ref 80.0–100.0)
MPV: 9 fL (ref 7.5–12.5)
Platelets: 339 10*3/uL (ref 140–400)
RBC: 4.61 10*6/uL (ref 4.20–5.80)
RDW: 12.3 % (ref 11.0–15.0)
WBC: 7.8 10*3/uL (ref 3.8–10.8)

## 2017-12-24 LAB — LIPID PANEL
CHOL/HDL RATIO: 3.7 (calc) (ref <5.0)
CHOLESTEROL: 209 mg/dL — AB (ref <200)
HDL: 57 mg/dL (ref >40)
LDL CHOLESTEROL (CALC): 123 mg/dL — AB
Non-HDL Cholesterol (Calc): 152 mg/dL (calc) — ABNORMAL HIGH (ref <130)
Triglycerides: 169 mg/dL — ABNORMAL HIGH (ref <150)

## 2017-12-24 LAB — HEMOGLOBIN A1C W/OUT EAG: Hgb A1c MFr Bld: 5.9 % of total Hgb — ABNORMAL HIGH (ref <5.7)

## 2017-12-26 ENCOUNTER — Encounter: Payer: Self-pay | Admitting: Physician Assistant

## 2017-12-26 DIAGNOSIS — R7303 Prediabetes: Secondary | ICD-10-CM | POA: Insufficient documentation

## 2017-12-26 HISTORY — DX: Prediabetes: R73.03

## 2018-08-17 DIAGNOSIS — Z86010 Personal history of colonic polyps: Secondary | ICD-10-CM | POA: Diagnosis not present

## 2018-08-17 DIAGNOSIS — K635 Polyp of colon: Secondary | ICD-10-CM | POA: Diagnosis not present

## 2018-08-17 DIAGNOSIS — D123 Benign neoplasm of transverse colon: Secondary | ICD-10-CM | POA: Diagnosis not present

## 2018-08-17 DIAGNOSIS — D125 Benign neoplasm of sigmoid colon: Secondary | ICD-10-CM | POA: Diagnosis not present

## 2018-08-17 LAB — HM COLONOSCOPY

## 2018-08-24 ENCOUNTER — Encounter: Payer: Self-pay | Admitting: Osteopathic Medicine

## 2018-08-28 ENCOUNTER — Encounter: Payer: Self-pay | Admitting: Osteopathic Medicine

## 2018-08-28 ENCOUNTER — Ambulatory Visit (INDEPENDENT_AMBULATORY_CARE_PROVIDER_SITE_OTHER): Payer: Medicare HMO | Admitting: Osteopathic Medicine

## 2018-08-28 VITALS — BP 122/83 | HR 93 | Temp 98.2°F | Wt 170.5 lb

## 2018-08-28 DIAGNOSIS — R319 Hematuria, unspecified: Secondary | ICD-10-CM | POA: Diagnosis not present

## 2018-08-28 DIAGNOSIS — R399 Unspecified symptoms and signs involving the genitourinary system: Secondary | ICD-10-CM

## 2018-08-28 LAB — POCT URINALYSIS DIPSTICK
Bilirubin, UA: NEGATIVE
Glucose, UA: NEGATIVE
Leukocytes, UA: NEGATIVE
Nitrite, UA: NEGATIVE
Protein, UA: NEGATIVE
Spec Grav, UA: 1.025 (ref 1.010–1.025)
Urobilinogen, UA: 0.2 E.U./dL
pH, UA: 5.5 (ref 5.0–8.0)

## 2018-08-28 MED ORDER — CIPROFLOXACIN HCL 500 MG PO TABS: 500.0000 mg | ORAL_TABLET | Freq: Two times a day (BID) | ORAL | 0 refills | Status: DC

## 2018-08-28 NOTE — Progress Notes (Signed)
HPI: David Maldonado is a 73 y.o. male who  has a past medical history of CHF (congestive heart failure) ([place]) and Prediabetes (12/26/2017).  he presents to [facility] today, 08/28/18,  for chief complaint of:  Groin pain   When he goes, feels like foul smell to urine. Feels similar to previous problems w/ prostatitis. He is feeling better over the past couple days but still wanted to come get checked out.      At today's visit 08/28/18 ... PMH, PSH, FH reviewed and updated as needed.  Current medication list and allergy/intolerance hx reviewed and updated as needed. (See remainder of HPI, ROS, Phys Exam below)   No results found.  Results for orders placed or performed in visit on 08/28/18 (from the past 72 hour(s))  POCT Urinalysis Dipstick     Status: None   Collection Time: 08/28/18  1:34 PM  Result Value Ref Range   Color, UA Yellow    Clarity, UA Clear    Glucose, UA Negative Negative   Bilirubin, UA Negative    Ketones, UA Trace    Spec Grav, UA 1.025 1.010 - 1.025   Blood, UA Moderate    pH, UA 5.5 5.0 - 8.0   Protein, UA Negative Negative   Urobilinogen, UA 0.2 0.2 or 1.0 E.U./dL   Nitrite, UA Negative    Leukocytes, UA Negative Negative   Appearance     Odor               ASSESSMENT/PLAN: The primary encounter diagnosis was Urinary tract infection symptoms. A diagnosis of Hematuria, unspecified type was also pertinent to this visit.  Rx printed for Cipro in case worse but advised wait for urine test results.   Orders Placed This Encounter  Procedures  . Urine Culture  . Urinalysis, microscopic only  . POCT Urinalysis Dipstick     Meds ordered this encounter  Medications  . ciprofloxacin (CIPRO) 500 MG tablet    Sig: Take 1 tablet (500 mg total) by mouth 2 (two) times daily.    Dispense:  20 tablet    Refill:  0    There are no Patient Instructions on file for this visit.    Follow-up plan: Return if  symptoms worsen or fail to improve.                                                 ################################################# ################################################# ################################################# #################################################    Current Meds  Medication Sig  . aspirin 81 MG tablet Take 1 tablet (81 mg total) by mouth daily.  . finasteride (PROSCAR) 5 MG tablet Take 5 mg daily by mouth.  Marland Kitchen glucosamine-chondroitin 500-400 MG tablet Take 1 tablet by mouth daily.  Marland Kitchen lisinopril (PRINIVIL,ZESTRIL) 20 MG tablet Take 1 tablet (20 mg total) by mouth daily. Pt needs f/u appt w/PCP for refills.  . Omega-3 Fatty Acids (FISH OIL PO) Take daily by mouth.  . pravastatin (PRAVACHOL) 40 MG tablet Take 1 tablet (40 mg total) by mouth daily. Pt needs f/u appt w/PCP for refills.  . tamsulosin (FLOMAX) 0.4 MG CAPS capsule Take 1 capsule (0.4 mg total) by mouth daily after breakfast.  . Zn-Pyg Afri-Nettle-Saw Palmet (SAW PALMETTO COMPLEX PO) Take daily by mouth.    Allergies  Allergen Reactions  . Simvastatin Other (See Comments)  myalgia myalgia       Review of Systems:  Constitutional: No recent illness, no fever/chills   HEENT: No  headache, no vision change  Cardiac: No  chest pain  Respiratory:  No  shortness of breath.   Gastrointestinal: No  abdominal pain, no change on bowel habits  Musculoskeletal: No new myalgia/arthralgia  Skin: No  Rash  Neurologic: No  weakness, No  Dizziness   Exam:  BP 122/83 (BP Location: Left Arm, Patient Position: Sitting, Cuff Size: Normal)   Pulse 93   Temp 98.2 F (36.8 C) (Oral)   Wt 170 lb 8 oz (77.3 kg)   BMI 25.18 kg/m   Constitutional: VS see above. General Appearance: alert, well-developed, well-nourished, NAD  Eyes: Normal lids and conjunctive, non-icteric sclera  Neck: No masses, trachea midline.   Respiratory: Normal  respiratory effort  Musculoskeletal: Gait normal. Symmetric and independent movement of all extremities  Neurological: Normal balance/coordination. No tremor.  Skin: warm, dry, intact.   Psychiatric: Normal judgment/insight. Normal mood and affect. Oriented x3.       Visit summary with medication list and pertinent instructions was printed for patient to review, patient was advised to alert [place] if any updates are needed. All questions at time of visit were answered - patient instructed to contact office with any additional concerns. ER/RTC precautions were reviewed with the patient and understanding verbalized.     Please note: voice recognition software was used to produce this document, and typos may escape review. Please contact Dr. Sheppard Coil for any needed clarifications.    Follow up plan: Return if symptoms worsen or fail to improve.

## 2018-08-30 LAB — URINE CULTURE
MICRO NUMBER:: 618276
Result:: NO GROWTH
SPECIMEN QUALITY:: ADEQUATE

## 2018-08-30 LAB — URINALYSIS, MICROSCOPIC ONLY
Bacteria, UA: NONE SEEN /HPF
Hyaline Cast: NONE SEEN /LPF
Squamous Epithelial / HPF: NONE SEEN /HPF (ref <=5)

## 2018-09-05 ENCOUNTER — Ambulatory Visit (INDEPENDENT_AMBULATORY_CARE_PROVIDER_SITE_OTHER): Payer: Medicare HMO | Admitting: Sports Medicine

## 2018-09-05 ENCOUNTER — Other Ambulatory Visit: Payer: Self-pay

## 2018-09-05 ENCOUNTER — Encounter: Payer: Self-pay | Admitting: Sports Medicine

## 2018-09-05 VITALS — BP 117/76 | HR 80 | Ht 69.0 in | Wt 171.0 lb

## 2018-09-05 DIAGNOSIS — N401 Enlarged prostate with lower urinary tract symptoms: Secondary | ICD-10-CM

## 2018-09-05 DIAGNOSIS — R319 Hematuria, unspecified: Secondary | ICD-10-CM | POA: Diagnosis not present

## 2018-09-05 DIAGNOSIS — R300 Dysuria: Secondary | ICD-10-CM

## 2018-09-05 DIAGNOSIS — I509 Heart failure, unspecified: Secondary | ICD-10-CM | POA: Diagnosis not present

## 2018-09-05 DIAGNOSIS — R3912 Poor urinary stream: Secondary | ICD-10-CM

## 2018-09-05 LAB — POCT URINALYSIS DIPSTICK
Bilirubin, UA: NEGATIVE
Glucose, UA: NEGATIVE
Leukocytes, UA: NEGATIVE
Nitrite, UA: NEGATIVE
Protein, UA: NEGATIVE
Spec Grav, UA: 1.025 (ref 1.010–1.025)
Urobilinogen, UA: 0.2 E.U./dL
pH, UA: 6 (ref 5.0–8.0)

## 2018-09-05 MED ORDER — TAMSULOSIN HCL 0.4 MG PO CAPS: 0.4000 mg | ORAL_CAPSULE | Freq: Two times a day (BID) | ORAL | 0 refills | Status: DC

## 2018-09-05 MED ORDER — CIPROFLOXACIN HCL 750 MG PO TABS: 750.0000 mg | ORAL_TABLET | Freq: Two times a day (BID) | ORAL | 0 refills | Status: DC

## 2018-09-05 NOTE — Assessment & Plan Note (Signed)
I think David Maldonado is having an episode of prostatitis. He recently had a colonoscopy which results in bacterial translocation across the wall, hematogenous spread and prostatic seeding. He has all of the symptoms including perineal pain and pressure, dysuria, dribbling, urgency, frequency. Adding a CBC with differential, CMP, PSA. Cipro for 2 weeks, high dose. Doubling Flomax to 0.4 mg twice daily. Return to see me in 2 weeks, awaiting urine culture.

## 2018-09-05 NOTE — Patient Instructions (Signed)

## 2018-09-05 NOTE — Progress Notes (Signed)
Subjective:    CC: Lower pelvic pain  HPI: This is a very pleasant 73 year old male, he has a history of BPH, more recently he has had increasing pelvic pain, pressure, weak urinary stream, dribbling, nocturia at least 3 times, no fevers, chills, muscle aches, body aches.  He did recently have a colonoscopy, symptoms started immediately afterwards.  No costovertebral angle pain, no upper abdominal pain.  No skin rashes.  I reviewed the past medical history, family history, social history, surgical history, and allergies today and no changes were needed.  Please see the problem list section below in epic for further details.  Past Medical History: Past Medical History:  Diagnosis Date  . CHF (congestive heart failure) ([place])   . Prediabetes 12/26/2017   Past Surgical History: Past Surgical History:  Procedure Laterality Date  . CYSTOSCOPY  2016  . HERNIA REPAIR     umbilical and R inguinal   Social History: Social History   Socioeconomic History  . Marital status: Married    Spouse name: Arbie Cookey  . Number of children: 2  . Years of education: 47  . Highest education level: 12th grade  Occupational History  . 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 Financial resource strain: Not hard at all  . Food insecurity    Worry: Never true    Inability: Never true  . Transportation needs    Medical: No    Non-medical: No  Tobacco Use  . Smoking status: Current Every Day Smoker    Packs/day: 0.50    Types: Cigars  . Smokeless tobacco: Never Used  Substance and Sexual Activity  . Alcohol use: Yes    Comment: 12 drinks a wk  . Drug use: No  . Sexual activity: Never  Lifestyle  . Physical activity    Days per week: 3 days    Minutes per session: 60 min  . Stress: Not at all  Relationships  . Social Herbalist on phone: Never    Gets together: Once a week    Attends religious service: Never    Active member of club or organization: Yes   Attends meetings of clubs or organizations: 1 to 4 times per year    Relationship status: Married  Other Topics Concern  . Not on file  Social History Narrative   Plays golf during the week for 4 hours and walks the golf course.   Family History: No family history on file. Allergies: Allergies  Allergen Reactions  . Simvastatin Other (See Comments)    myalgia myalgia   Medications: See med rec.  Review of Systems: No fevers, chills, night sweats, weight loss, chest pain, or shortness of breath.   Objective:    General: Well Developed, well nourished, and in no acute distress.  Neuro: Alert and oriented x3, extra-ocular muscles intact, sensation grossly intact.  HEENT: Normocephalic, atraumatic, pupils equal round reactive to light, neck supple, no masses, no lymphadenopathy, thyroid nonpalpable.  Skin: Warm and dry, no rashes. Cardiac: Regular rate and rhythm, no murmurs rubs or gallops, no lower extremity edema.  Respiratory: Clear to auscultation bilaterally. Not using accessory muscles, speaking in full sentences. Abdomen: Soft, nontender, normal bowel sounds, no palpable masses, no guarding, rigidity, rebound tenderness, no costovertebral angle pain.  Urinalysis is positive for blood.  Impression and Recommendations:    Benign prostatic hyperplasia with weak urinary stream I think David Maldonado is having an episode of prostatitis. He recently had  a colonoscopy which results in bacterial translocation across the wall, hematogenous spread and prostatic seeding. He has all of the symptoms including perineal pain and pressure, dysuria, dribbling, urgency, frequency. Adding a CBC with differential, CMP, PSA. Cipro for 2 weeks, high dose. Doubling Flomax to 0.4 mg twice daily. Return to see me in 2 weeks, awaiting urine culture.   ___________________________________________ Gwen Her. Dianah Field, M.D., ABFM., CAQSM. Primary Care and Sports Medicine [facility]  [facility]  Adjunct Professor of [employer] [facility] [employer]

## 2018-09-06 LAB — COMPLETE METABOLIC PANEL WITH GFR
AG Ratio: 1.4 (calc) (ref 1.0–2.5)
ALT: 21 U/L (ref 9–46)
AST: 18 U/L (ref 10–35)
Albumin: 4.4 g/dL (ref 3.6–5.1)
Alkaline phosphatase (APISO): 64 U/L (ref 35–144)
BUN: 22 mg/dL (ref 7–25)
CO2: 27 mmol/L (ref 20–32)
Calcium: 9.7 mg/dL (ref 8.6–10.3)
Chloride: 102 mmol/L (ref 98–110)
Creat: 1 mg/dL (ref 0.70–1.18)
GFR, Est African American: 87 mL/min/{1.73_m2} (ref >=60)
GFR, Est Non African American: 75 mL/min/{1.73_m2} (ref >=60)
Globulin: 3.2 g/dL (calc) (ref 1.9–3.7)
Glucose, Bld: 78 mg/dL (ref 65–99)
Potassium: 4.5 mmol/L (ref 3.5–5.3)
Sodium: 136 mmol/L (ref 135–146)
Total Bilirubin: 0.4 mg/dL (ref 0.2–1.2)
Total Protein: 7.6 g/dL (ref 6.1–8.1)

## 2018-09-06 LAB — CBC WITH DIFFERENTIAL/PLATELET
Absolute Monocytes: 906 cells/uL (ref 200–950)
Basophils Absolute: 53 cells/uL (ref 0–200)
Basophils Relative: 0.6 %
Eosinophils Absolute: 114 cells/uL (ref 15–500)
Eosinophils Relative: 1.3 %
HCT: 40.3 % (ref 38.5–50.0)
Hemoglobin: 13.9 g/dL (ref 13.2–17.1)
Lymphs Abs: 1663 cells/uL (ref 850–3900)
MCH: 32 pg (ref 27.0–33.0)
MCHC: 34.5 g/dL (ref 32.0–36.0)
MCV: 92.9 fL (ref 80.0–100.0)
MPV: 8.9 fL (ref 7.5–12.5)
Monocytes Relative: 10.3 %
Neutro Abs: 6063 cells/uL (ref 1500–7800)
Neutrophils Relative %: 68.9 %
Platelets: 361 10*3/uL (ref 140–400)
RBC: 4.34 10*6/uL (ref 4.20–5.80)
RDW: 12.6 % (ref 11.0–15.0)
Total Lymphocyte: 18.9 %
WBC: 8.8 10*3/uL (ref 3.8–10.8)

## 2018-09-06 LAB — URINE CULTURE
MICRO NUMBER:: 641743
SPECIMEN QUALITY:: ADEQUATE

## 2018-09-06 LAB — PSA, TOTAL AND FREE
PSA, % Free: 33 % (calc) (ref >25)
PSA, Free: 0.1 ng/mL
PSA, Total: 0.3 ng/mL (ref <=4.0)

## 2018-09-06 NOTE — Progress Notes (Signed)
Pt notified and did not have any further questions.

## 2018-09-19 ENCOUNTER — Other Ambulatory Visit: Payer: Self-pay

## 2018-09-19 ENCOUNTER — Ambulatory Visit (INDEPENDENT_AMBULATORY_CARE_PROVIDER_SITE_OTHER): Payer: Medicare HMO | Admitting: Sports Medicine

## 2018-09-19 ENCOUNTER — Encounter: Payer: Self-pay | Admitting: Sports Medicine

## 2018-09-19 DIAGNOSIS — R3912 Poor urinary stream: Secondary | ICD-10-CM

## 2018-09-19 DIAGNOSIS — N401 Enlarged prostate with lower urinary tract symptoms: Secondary | ICD-10-CM

## 2018-09-19 MED ORDER — CIPROFLOXACIN HCL 750 MG PO TABS: 750.0000 mg | ORAL_TABLET | Freq: Two times a day (BID) | ORAL | 0 refills | Status: AC

## 2018-09-19 MED ORDER — TADALAFIL 5 MG PO TABS: 5.0000 mg | ORAL_TABLET | Freq: Every day | ORAL | 11 refills | Status: DC

## 2018-09-19 NOTE — Assessment & Plan Note (Signed)
I am going to add an additional 2 weeks of Cipro for suspected prostatitis. Continue Flomax twice daily. Adding Cialis 5 mg daily. He did have some tenderness on the left prostatic lobe on rectal exam today. It really did not feel all that big toe.

## 2018-09-19 NOTE — Progress Notes (Signed)
Subjective:    CC: Follow-up  HPI: This is a pleasant 73 year old male, we have been treating him for obstructive uropathy and suspected prostatitis, his PSA did come back normal, we doubled his Flomax and added Cipro.  He returns today, 2 weeks after the initial visit with persistence of symptoms, mostly weak stream, dribbling, incomplete emptying.  No fevers, chills, no burning, dysuria.  I reviewed the past medical history, family history, social history, surgical history, and allergies today and no changes were needed.  Please see the problem list section below in epic for further details.  Past Medical History: Past Medical History:  Diagnosis Date  . CHF (congestive heart failure) ([place])   . Prediabetes 12/26/2017   Past Surgical History: Past Surgical History:  Procedure Laterality Date  . CYSTOSCOPY  2016  . HERNIA REPAIR     umbilical and R inguinal   Social History: Social History   Socioeconomic History  . Marital status: Married    Spouse name: Arbie Cookey  . Number of children: 2  . Years of education: 34  . Highest education level: 12th grade  Occupational History  . 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 Financial resource strain: Not hard at all  . Food insecurity    Worry: Never true    Inability: Never true  . Transportation needs    Medical: No    Non-medical: No  Tobacco Use  . Smoking status: Current Every Day Smoker    Packs/day: 0.50    Types: Cigars  . Smokeless tobacco: Never Used  Substance and Sexual Activity  . Alcohol use: Yes    Comment: 12 drinks a wk  . Drug use: No  . Sexual activity: Never  Lifestyle  . Physical activity    Days per week: 3 days    Minutes per session: 60 min  . Stress: Not at all  Relationships  . Social Herbalist on phone: Never    Gets together: Once a week    Attends religious service: Never    Active member of club or organization: Yes    Attends meetings of clubs or  organizations: 1 to 4 times per year    Relationship status: Married  Other Topics Concern  . Not on file  Social History Narrative   Plays golf during the week for 4 hours and walks the golf course.   Family History: No family history on file. Allergies: Allergies  Allergen Reactions  . Simvastatin Other (See Comments)    myalgia myalgia   Medications: See med rec.  Review of Systems: No fevers, chills, night sweats, weight loss, chest pain, or shortness of breath.   Objective:    General: Well Developed, well nourished, and in no acute distress.  Neuro: Alert and oriented x3, extra-ocular muscles intact, sensation grossly intact.  HEENT: Normocephalic, atraumatic, pupils equal round reactive to light, neck supple, no masses, no lymphadenopathy, thyroid nonpalpable.  Skin: Warm and dry, no rashes. Cardiac: Regular rate and rhythm, no murmurs rubs or gallops, no lower extremity edema.  Respiratory: Clear to auscultation bilaterally. Not using accessory muscles, speaking in full sentences. Rectal: Good tone, smooth prostate, slightly larger left lobe with minimal tenderness to palpation.  Impression and Recommendations:    Benign prostatic hyperplasia with weak urinary stream I am going to add an additional 2 weeks of Cipro for suspected prostatitis. Continue Flomax twice daily. Adding Cialis 5 mg daily. He did have some  tenderness on the left prostatic lobe on rectal exam today. It really did not feel all that big toe.   ___________________________________________ Gwen Her. Dianah Field, M.D., ABFM., CAQSM. Primary Care and Sports Medicine [facility]  Adjunct Professor of [employer] [employer] [facility] of Medicine

## 2018-10-17 ENCOUNTER — Ambulatory Visit (INDEPENDENT_AMBULATORY_CARE_PROVIDER_SITE_OTHER): Payer: Medicare HMO | Admitting: Sports Medicine

## 2018-10-17 ENCOUNTER — Other Ambulatory Visit: Payer: Self-pay

## 2018-10-17 DIAGNOSIS — R3912 Poor urinary stream: Secondary | ICD-10-CM | POA: Diagnosis not present

## 2018-10-17 DIAGNOSIS — N401 Enlarged prostate with lower urinary tract symptoms: Secondary | ICD-10-CM

## 2018-10-17 MED ORDER — TADALAFIL 5 MG PO TABS: 5.0000 mg | ORAL_TABLET | Freq: Every day | ORAL | 11 refills | Status: DC

## 2018-10-17 NOTE — Progress Notes (Signed)
Subjective:    CC: Obstructive uropathy  HPI: BPH with obstructive uropathy, we have completed a full month of Cipro.  His PSA was normal, his rectal exam was for the most part unremarkable with the exception of a bit of tenderness in the left prostatic lobe on exam back on the 21st of last month.  We added Flomax twice a day which was not sufficiently efficacious.  I also added Cialis 5 mg daily at the last visit, it was too expensive but he did not let us know so he has been without Cialis for a whole month.  Continues to have hesitancy, dribbling, weak stream, nocturia.  I reviewed the past medical history, family history, social history, surgical history, and allergies today and no changes were needed.  Please see the problem list section below in epic for further details.  Past Medical History: Past Medical History:  Diagnosis Date  . CHF (congestive heart failure) ([place])   . Prediabetes 12/26/2017   Past Surgical History: Past Surgical History:  Procedure Laterality Date  . CYSTOSCOPY  2016  . HERNIA REPAIR     umbilical and R inguinal   Social History: Social History   Socioeconomic History  . Marital status: Married    Spouse name: Arbie Cookey  . Number of children: 2  . Years of education: 66  . Highest education level: 12th grade  Occupational History  . 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 Financial resource strain: Not hard at all  . Food insecurity    Worry: Never true    Inability: Never true  . Transportation needs    Medical: No    Non-medical: No  Tobacco Use  . Smoking status: Current Every Day Smoker    Packs/day: 0.50    Types: Cigars  . Smokeless tobacco: Never Used  Substance and Sexual Activity  . Alcohol use: Yes    Comment: 12 drinks a wk  . Drug use: No  . Sexual activity: Never  Lifestyle  . Physical activity    Days per week: 3 days    Minutes per session: 60 min  . Stress: Not at all  Relationships  . Social  Herbalist on phone: Never    Gets together: Once a week    Attends religious service: Never    Active member of club or organization: Yes    Attends meetings of clubs or organizations: 1 to 4 times per year    Relationship status: Married  Other Topics Concern  . Not on file  Social History Narrative   Plays golf during the week for 4 hours and walks the golf course.   Family History: No family history on file. Allergies: Allergies  Allergen Reactions  . Simvastatin Other (See Comments)    myalgia myalgia   Medications: See med rec.  Review of Systems: No fevers, chills, night sweats, weight loss, chest pain, or shortness of breath.   Objective:    General: Well Developed, well nourished, and in no acute distress.  Neuro: Alert and oriented x3, extra-ocular muscles intact, sensation grossly intact.  HEENT: Normocephalic, atraumatic, pupils equal round reactive to light, neck supple, no masses, no lymphadenopathy, thyroid nonpalpable.  Skin: Warm and dry, no rashes. Cardiac: Regular rate and rhythm, no murmurs rubs or gallops, no lower extremity edema.  Respiratory: Clear to auscultation bilaterally. Not using accessory muscles, speaking in full sentences.  Impression and Recommendations:    Benign prostatic  hyperplasia with weak urinary stream BPH with obstructive uropathy, we have completed a full month of Cipro. We added Flomax twice a day which was not sufficiently efficacious. I also added Cialis 5 mg daily at the last visit, it was too expensive but he did not let us know so he has been without Cialis for a whole month. Today I printed him out a good Rx coupon and Cialis again so we will try to get it to him for cheaper. He has a visit with urology coming up next week, return to see me in 1 month.   ___________________________________________ Gwen Her. Dianah Field, M.D., ABFM., CAQSM. Primary Care and Sports Medicine [facility]   Adjunct Professor of [place] [employer] [facility] of Medicine

## 2018-10-17 NOTE — Assessment & Plan Note (Signed)
BPH with obstructive uropathy, we have completed a full month of Cipro. We added Flomax twice a day which was not sufficiently efficacious. I also added Cialis 5 mg daily at the last visit, it was too expensive but he did not let us know so he has been without Cialis for a whole month. Today I printed him out a good Rx coupon and Cialis again so we will try to get it to him for cheaper. He has a visit with urology coming up next week, return to see me in 1 month.

## 2018-10-23 DIAGNOSIS — N400 Enlarged prostate without lower urinary tract symptoms: Secondary | ICD-10-CM | POA: Diagnosis not present

## 2018-10-30 ENCOUNTER — Ambulatory Visit (INDEPENDENT_AMBULATORY_CARE_PROVIDER_SITE_OTHER): Payer: Medicare HMO

## 2018-10-30 ENCOUNTER — Encounter: Payer: Self-pay | Admitting: Sports Medicine

## 2018-10-30 ENCOUNTER — Ambulatory Visit (INDEPENDENT_AMBULATORY_CARE_PROVIDER_SITE_OTHER): Payer: Medicare HMO | Admitting: Sports Medicine

## 2018-10-30 ENCOUNTER — Other Ambulatory Visit: Payer: Self-pay

## 2018-10-30 DIAGNOSIS — M19071 Primary osteoarthritis, right ankle and foot: Secondary | ICD-10-CM | POA: Diagnosis not present

## 2018-10-30 DIAGNOSIS — M25474 Effusion, right foot: Secondary | ICD-10-CM | POA: Insufficient documentation

## 2018-10-30 NOTE — Assessment & Plan Note (Signed)
Unclear etiology, clear fluid with arthrocentesis, sending this for crystal analysis. Injected. X-rays. There was a joint mouse. Return to see me in 4 weeks.

## 2018-10-30 NOTE — Progress Notes (Signed)
Subjective:    CC: Right foot swelling  HPI: For the past week David Maldonado has had redness, swelling in his right great toe at the MTP.  It has improved considerably.  Moderate, persistent, localized without radiation  I reviewed the past medical history, family history, social history, surgical history, and allergies today and no changes were needed.  Please see the problem list section below in epic for further details.  Past Medical History: Past Medical History:  Diagnosis Date  . CHF (congestive heart failure) ([place])   . Prediabetes 12/26/2017   Past Surgical History: Past Surgical History:  Procedure Laterality Date  . CYSTOSCOPY  2016  . HERNIA REPAIR     umbilical and R inguinal   Social History: Social History   Socioeconomic History  . Marital status: Married    Spouse name: Arbie Cookey  . Number of children: 2  . Years of education: 72  . Highest education level: 12th grade  Occupational History  . 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 Financial resource strain: Not hard at all  . Food insecurity    Worry: Never true    Inability: Never true  . Transportation needs    Medical: No    Non-medical: No  Tobacco Use  . Smoking status: Current Every Day Smoker    Packs/day: 0.50    Types: Cigars  . Smokeless tobacco: Never Used  Substance and Sexual Activity  . Alcohol use: Yes    Comment: 12 drinks a wk  . Drug use: No  . Sexual activity: Never  Lifestyle  . Physical activity    Days per week: 3 days    Minutes per session: 60 min  . Stress: Not at all  Relationships  . Social Herbalist on phone: Never    Gets together: Once a week    Attends religious service: Never    Active member of club or organization: Yes    Attends meetings of clubs or organizations: 1 to 4 times per year    Relationship status: Married  Other Topics Concern  . Not on file  Social History Narrative   Plays golf during the week for 4 hours and  walks the golf course.   Family History: No family history on file. Allergies: Allergies  Allergen Reactions  . Simvastatin Other (See Comments)    myalgia myalgia   Medications: See med rec.  Review of Systems: No fevers, chills, night sweats, weight loss, chest pain, or shortness of breath.   Objective:    General: Well Developed, well nourished, and in no acute distress.  Neuro: Alert and oriented x3, extra-ocular muscles intact, sensation grossly intact.  HEENT: Normocephalic, atraumatic, pupils equal round reactive to light, neck supple, no masses, no lymphadenopathy, thyroid nonpalpable.  Skin: Warm and dry, no rashes. Cardiac: Regular rate and rhythm, no murmurs rubs or gallops, no lower extremity edema.  Respiratory: Clear to auscultation bilaterally. Not using accessory muscles, speaking in full sentences. Right foot: No visible erythema or swelling. Range of motion is full in all directions. Strength is 5/5 in all directions. No hallux valgus. No pes cavus or pes planus. No abnormal callus noted. No pain over the navicular prominence, or base of fifth metatarsal. No tenderness to palpation of the calcaneal insertion of plantar fascia. No pain at the Achilles insertion. No pain over the calcaneal bursa. No pain of the retrocalcaneal bursa. Red, swollen first MTP with palpable synovitis No  hallux rigidus or limitus. No tenderness palpation over interphalangeal joints. No pain with compression of the metatarsal heads. Neurovascularly intact distally.  Procedure: Real-time Ultrasound Guided  aspiration/injection of right first MTP Device: GE Logiq E  Verbal informed consent obtained.  Time-out conducted.  Noted no overlying erythema, induration, or other signs of local infection.  Skin prepped in a sterile fashion.  Local anesthesia: Topical Ethyl chloride.  With sterile technique and under real time ultrasound guidance:  Using a 22-gauge needle advanced into the  MTP, there was significant synovitis and a bit of effusion, I aspirated nearly a full milliliter of clear, light-colored fluid, syringe switched and 1/2 cc Kenalog 40, 1/2 cc lidocaine injected easily. Completed without difficulty  Pain immediately resolved suggesting accurate placement of the medication.  Advised to call if fevers/chills, erythema, induration, drainage, or persistent bleeding.  Images permanently stored and available for review in the ultrasound unit.  Impression: Technically successful ultrasound guided injection.  Impression and Recommendations:    Swelling of first metatarsophalangeal (MTP) joint of right foot Unclear etiology, clear fluid with arthrocentesis, sending this for crystal analysis. Injected. X-rays. There was a joint mouse. Return to see me in 4 weeks.   ___________________________________________ Gwen Her. Dianah Field, M.D., ABFM., CAQSM. Primary Care and Sports Medicine [facility]  Adjunct Professor of [employer] Medicine

## 2018-10-31 LAB — SYNOVIAL FLUID, CRYSTAL

## 2018-11-10 ENCOUNTER — Other Ambulatory Visit: Payer: Self-pay | Admitting: Osteopathic Medicine

## 2018-11-10 DIAGNOSIS — I10 Essential (primary) hypertension: Secondary | ICD-10-CM

## 2018-11-10 DIAGNOSIS — E782 Mixed hyperlipidemia: Secondary | ICD-10-CM

## 2018-11-10 NOTE — Telephone Encounter (Signed)
Requested medication (s) are due for refill today: yes  Requested medication (s) are on the active medication list: yes  Last refill:  08/10/2018  Future visit scheduled: no  Notes to clinic: requesting year supply   Requested Prescriptions  Pending Prescriptions Disp Refills   lisinopril (ZESTRIL) 20 MG tablet [Pharmacy Med Name: LISINOPRIL 20 MG Tablet] 90 tablet 3    Sig: TAKE 1 TABLET EVERY DAY     Cardiovascular:  ACE Inhibitors Passed - 11/10/2018  1:26 PM      Passed - Cr in normal range and within 180 days    Creat  Date Value Ref Range Status  09/05/2018 1.00 0.70 - 1.18 mg/dL Final    Comment:    For patients >29 years of age, the reference limit for Creatinine is approximately 13% higher for people identified as African-American. .          Passed - K in normal range and within 180 days    Potassium  Date Value Ref Range Status  09/05/2018 4.5 3.5 - 5.3 mmol/L Final         Passed - Patient is not pregnant      Passed - Last BP in normal range    BP Readings from Last 1 Encounters:  10/30/18 103/64         Passed - Valid encounter within last 6 months    Recent Outpatient Visits          1 week ago Swelling of first metatarsophalangeal (MTP) joint of right foot   [facility], Gwen Her, MD   3 weeks ago Benign prostatic hyperplasia with weak urinary stream   [place] [facility], Gwen Her, MD   1 month ago Benign prostatic hyperplasia with weak urinary stream   [place] [facility], Gwen Her, MD   2 months ago Hematuria, unspecified type   [place], Gwen Her, MD   2 months ago Urinary tract infection symptoms   [place] [facility], Lanelle Bal, DO      Future Appointments            In 2 weeks [facility]             pravastatin (PRAVACHOL) 40 MG tablet [Pharmacy Med Name: PRAVASTATIN SODIUM 40 MG Tablet] 90 tablet 3    Sig: TAKE 1 TABLET EVERY DAY     Cardiovascular:  Antilipid - Statins Failed - 11/10/2018  1:26 PM      Failed - Total Cholesterol in normal range and within 360 days    Cholesterol  Date Value Ref Range Status  12/21/2017 209 (H) <200 mg/dL Final         Failed - LDL in normal range and within 360 days    LDL Cholesterol (Calc)  Date Value Ref Range Status  12/21/2017 123 (H) mg/dL (calc) Final    Comment:    Reference range: <100 . Desirable range <100 mg/dL for primary prevention;   <70 mg/dL for patients with CHD or diabetic patients  with > or = 2 CHD risk factors. Marland Kitchen LDL-C is now calculated using the Martin-Hopkins  calculation, which is a validated novel method providing  better accuracy than the Friedewald equation in the  estimation of LDL-C.  Cresenciano Genre et al. Annamaria Helling. MU:[id number]): 2061-2068  (http://education.[employer].com/faq/FAQ164)  Failed - Triglycerides in normal range and within 360 days    Triglycerides  Date Value Ref Range Status  12/21/2017 169 (H) <150 mg/dL Final         Passed - HDL in normal range and within 360 days    HDL  Date Value Ref Range Status  12/21/2017 57 >40 mg/dL Final         Passed - Patient is not pregnant      Passed - Valid encounter within last 12 months    Recent Outpatient Visits          1 week ago Swelling of first metatarsophalangeal (MTP) joint of right foot   [place] [facility], Gwen Her, MD   3 weeks ago Benign prostatic hyperplasia with weak urinary stream   [place] [facility], Gwen Her, MD   1 month ago Benign prostatic hyperplasia with weak urinary stream   [place] [facility], Gwen Her, MD   2 months ago Hematuria, unspecified type   [facility], Gwen Her, MD   2 months ago Urinary tract infection symptoms   [facility] [place], DO      Future Appointments            In 2 weeks [facility]

## 2018-11-15 ENCOUNTER — Ambulatory Visit: Payer: Medicare HMO | Admitting: Sports Medicine

## 2018-11-27 ENCOUNTER — Other Ambulatory Visit: Payer: Self-pay

## 2018-11-27 ENCOUNTER — Ambulatory Visit (INDEPENDENT_AMBULATORY_CARE_PROVIDER_SITE_OTHER): Payer: Medicare HMO | Admitting: *Deleted

## 2018-11-27 VITALS — BP 108/55 | HR 68 | Temp 98.0°F | Ht 69.0 in | Wt 173.0 lb

## 2018-11-27 DIAGNOSIS — Z0000 Encounter for general adult medical examination without abnormal findings: Secondary | ICD-10-CM | POA: Diagnosis not present

## 2018-11-27 DIAGNOSIS — Z23 Encounter for immunization: Secondary | ICD-10-CM

## 2018-11-27 NOTE — Progress Notes (Signed)
Subjective:   David Maldonado is a 73 y.o. male who presents for Medicare Annual/Subsequent preventive examination.  Review of Systems:  No ROS.  Medicare Wellness Virtual Visit.  Visual/audio telehealth visit, UTA vital signs.   See social history for additional risk factors.    Cardiac Risk Factors include: advanced age (>54men, >53 women);male gender;hypertension;smoking/ tobacco exposure  Sleep patterns: Getting on average 7 hours of sleep a night.Wakes up 2 times during the night to void. Wakes refreshed in the am. Home Safety/Smoke Alarms: Feels safe in home. Smoke alarms in place.  Living environment; Lives with wife in 1 story home anad handrails on the steps. Shower is a walk in shower ands bench in place. Seat Belt Safety/Bike Helmet: Wears seat belt.    Male:   CCS-  UTD   PSA-  UTD Lab Results  Component Value Date   PSA 1.120 04/07/2016        Objective:    Vitals: There were no vitals taken for this visit.  There is no height or weight on file to calculate BMI.  Advanced Directives 11/27/2018 11/21/2017  Does Patient Have a Medical Advance Directive? Yes Yes  Type of Paramedic of [place];Living will [place];Living will  Does patient want to make changes to medical advance directive? No - Patient declined No - Patient declined  Copy of [place] in Chart? - No - copy requested    Tobacco Social History   Tobacco Use  Smoking Status Current Every Day Smoker  . Packs/day: 0.50  . Types: Cigars, Cigarettes  Smokeless Tobacco Never Used     Ready to quit: Not Answered Counseling given: Not Answered   Clinical Intake:  Pre-visit preparation completed: Yes  Pain : No/denies pain     Nutritional Risks: None Diabetes: No  How often do you need to have someone help you when you read instructions, pamphlets, or other written materials from your doctor or pharmacy?: 1 - Never What is  the last grade level you completed in school?: 14  Interpreter Needed?: No  Information entered by :: Orlie Dakin, LPN  Past Medical History:  Diagnosis Date  . CHF (congestive heart failure) ([place])   . Prediabetes 12/26/2017   Past Surgical History:  Procedure Laterality Date  . CYSTOSCOPY  2016  . HERNIA REPAIR     umbilical and R inguinal   History reviewed. No pertinent family history. Social History   Socioeconomic History  . Marital status: Married    Spouse name: Arbie Cookey  . Number of children: 2  . Years of education: 76  . Highest education level: 12th grade  Occupational History  . 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 Financial resource strain: Not hard at all  . Food insecurity    Worry: Never true    Inability: Never true  . Transportation needs    Medical: No    Non-medical: No  Tobacco Use  . Smoking status: Current Every Day Smoker    Packs/day: 0.50    Types: Cigars, Cigarettes  . Smokeless tobacco: Never Used  Substance and Sexual Activity  . Alcohol use: Yes    Comment: 12 drinks a wk  . Drug use: No  . Sexual activity: Never  Lifestyle  . Physical activity    Days per week: 3 days    Minutes per session: 60 min  . Stress: Not at all  Relationships  .  Social Herbalist on phone: Once a week    Gets together: Never    Attends religious service: Never    Active member of club or organization: Yes    Attends meetings of clubs or organizations: 1 to 4 times per year    Relationship status: Married  Other Topics Concern  . Not on file  Social History Narrative   Plays golf during the week for 4 hours and walks the golf course.    Outpatient Encounter Medications as of 11/27/2018  Medication Sig  . aspirin 81 MG tablet Take 1 tablet (81 mg total) by mouth daily.  . finasteride (PROSCAR) 5 MG tablet Take 5 mg daily by mouth.  Marland Kitchen glucosamine-chondroitin 500-400 MG tablet Take 1 tablet by mouth daily.  Marland Kitchen  lisinopril (ZESTRIL) 20 MG tablet TAKE 1 TABLET EVERY DAY  . Omega-3 Fatty Acids (FISH OIL PO) Take daily by mouth.  . pravastatin (PRAVACHOL) 40 MG tablet TAKE 1 TABLET EVERY DAY  . tadalafil (CIALIS) 5 MG tablet Take 1 tablet (5 mg total) by mouth daily.  . tamsulosin (FLOMAX) 0.4 MG CAPS capsule Take 1 capsule (0.4 mg total) by mouth 2 (two) times a day.  Marland Kitchen Zn-Pyg Afri-Nettle-Saw Palmet (SAW PALMETTO COMPLEX PO) Take daily by mouth.   No facility-administered encounter medications on file as of 11/27/2018.     Activities of Daily Living In your present state of health, do you have any difficulty performing the following activities: 11/27/2018  Hearing? N  Vision? N  Difficulty concentrating or making decisions? N  Walking or climbing stairs? N  Dressing or bathing? N  Doing errands, shopping? N  Preparing Food and eating ? N  Using the Toilet? N  In the past six months, have you accidently leaked urine? N  Do you have problems with loss of bowel control? N  Managing your Medications? N  Managing your Finances? N  Housekeeping or managing your Housekeeping? N  Some recent data might be hidden    Patient Care Team: Emeterio Reeve, DO as PCP - General (Osteopathic Medicine)   Assessment:   This is a routine wellness examination for [place].Physical assessment deferred to PCP.   Exercise Activities and Dietary recommendations Current Exercise Habits: Home exercise routine, Type of exercise: walking, Time (Minutes): 30, Frequency (Times/Week): 7, Weekly Exercise (Minutes/Week): 210, Intensity: Mild, Exercise limited by: None identified Diet Eats fairly healthy of fruits, vegetables and proteins. Breakfast: snack bar and coffee Lunch: leftovers Dinner: Meat and vegetable    Water intake is 1-2 bottle a day  Goals    . Patient Stated     Would like to have less pain from arthritis       Fall Risk Fall Risk  11/27/2018 11/21/2017 09/15/2016  Falls in the past year? 0 No No   Number falls in past yr: 0 - -  Injury with Fall? 0 - -  Follow up Falls prevention discussed - -   Is the patient's home free of loose throw rugs in walkways, pet beds, electrical cords, etc?   yes      Grab bars in the bathroom? yes      Handrails on the stairs?   yes      Adequate lighting?   yes   Depression Screen PHQ 2/9 Scores 11/27/2018 12/21/2017 11/21/2017 09/15/2016  PHQ - 2 Score 0 0 0 0  PHQ- 9 Score - 0 - -    Cognitive Function  6CIT Screen 11/27/2018 09/15/2016  What Year? 0 points 0 points  What month? 0 points 0 points  What time? 0 points 0 points  Count back from 20 0 points 0 points  Months in reverse 0 points 0 points  Repeat phrase 2 points 0 points  Total Score 2 0    Immunization History  Administered Date(s) Administered  . Influenza, High Dose Seasonal PF 12/21/2017  . Tdap 03/01/2009    Screening Tests Health Maintenance  Topic Date Due  . Hepatitis C Screening  1945/03/17  . INFLUENZA VACCINE  09/30/2018  . PNA vac Low Risk Adult (1 of 2 - PCV13) 10/01/2034 (Originally 05/09/2012)  . TETANUS/TDAP  03/02/2019  . COLONOSCOPY  08/16/2028         Plan:      Mr. Brumbley , Thank you for taking time to come for your Medicare Wellness Visit. I appreciate your ongoing commitment to your health goals. Please review the following plan we discussed and let me know if I can assist you in the future.  Please schedule your next medicare wellness visit with me in 1 yr.   These are the goals we discussed: Goals    . Patient Stated     Would like to have less pain from arthritis       This is a list of the screening recommended for you and due dates:  Health Maintenance  Topic Date Due  .  Hepatitis C: One time screening is recommended by Center for Disease Control  (CDC) for  adults born from [birth year] through 1963.   28-Feb-1946  . Flu Shot  09/30/2018  . Pneumonia vaccines (1 of 2 - PCV13) 10/01/2034*  . Tetanus Vaccine  03/02/2019  . Colon  Cancer Screening  08/16/2028  *Topic was postponed. The date shown is not the original due date.     I have personally reviewed and noted the following in the patient's chart:   . Medical and social history . Use of alcohol, tobacco or illicit drugs  . Current medications and supplements . Functional ability and status . Nutritional status . Physical activity . Advanced directives . List of other physicians . Hospitalizations, surgeries, and ER visits in previous 12 months . Vitals . Screenings to include cognitive, depression, and falls . Referrals and appointments  In addition, I have reviewed and discussed with patient certain preventive protocols, quality metrics, and best practice recommendations. A written personalized care plan for preventive services as well as general preventive health recommendations were provided to patient.     Joanne Chars, LPN  [phone]

## 2018-11-27 NOTE — Patient Instructions (Signed)
Mr. Kope , Thank you for taking time to come for your Medicare Wellness Visit. I appreciate your ongoing commitment to your health goals. Please review the following plan we discussed and let me know if I can assist you in the future.  Please schedule your next medicare wellness visit with me in 1 yr.

## 2018-11-29 ENCOUNTER — Encounter: Payer: Self-pay | Admitting: Sports Medicine

## 2018-11-29 ENCOUNTER — Ambulatory Visit (INDEPENDENT_AMBULATORY_CARE_PROVIDER_SITE_OTHER): Payer: Medicare HMO

## 2018-11-29 ENCOUNTER — Other Ambulatory Visit: Payer: Self-pay

## 2018-11-29 ENCOUNTER — Ambulatory Visit (INDEPENDENT_AMBULATORY_CARE_PROVIDER_SITE_OTHER): Payer: Medicare HMO | Admitting: Sports Medicine

## 2018-11-29 DIAGNOSIS — M19012 Primary osteoarthritis, left shoulder: Secondary | ICD-10-CM | POA: Diagnosis not present

## 2018-11-29 DIAGNOSIS — M47812 Spondylosis without myelopathy or radiculopathy, cervical region: Secondary | ICD-10-CM

## 2018-11-29 DIAGNOSIS — M542 Cervicalgia: Secondary | ICD-10-CM | POA: Diagnosis not present

## 2018-11-29 DIAGNOSIS — M19011 Primary osteoarthritis, right shoulder: Secondary | ICD-10-CM

## 2018-11-29 MED ORDER — MELOXICAM 15 MG PO TABS: ORAL_TABLET | ORAL | 3 refills | Status: DC

## 2018-11-29 NOTE — Progress Notes (Signed)
Subjective:    CC: Neck pain  HPI: This is a very pleasant 73 year old male, for years now he is had pain in his neck, left-sided, radiating from the skull base to the left trapezius, nothing overtly radicular down to the hands or fingertips, pain is mild, intermittent.  Localized.  He also has a lump on his right shoulder that he would like me to look at.  I reviewed the past medical history, family history, social history, surgical history, and allergies today and no changes were needed.  Please see the problem list section below in epic for further details.  Past Medical History: Past Medical History:  Diagnosis Date  . CHF (congestive heart failure) ([place])   . Prediabetes 12/26/2017   Past Surgical History: Past Surgical History:  Procedure Laterality Date  . CYSTOSCOPY  2016  . HERNIA REPAIR     umbilical and R inguinal   Social History: Social History   Socioeconomic History  . Marital status: Married    Spouse name: Arbie Cookey  . Number of children: 2  . Years of education: 76  . Highest education level: 12th grade  Occupational History  . 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 Financial resource strain: Not hard at all  . Food insecurity    Worry: Never true    Inability: Never true  . Transportation needs    Medical: No    Non-medical: No  Tobacco Use  . Smoking status: Current Every Day Smoker    Packs/day: 0.50    Types: Cigars, Cigarettes  . Smokeless tobacco: Never Used  Substance and Sexual Activity  . Alcohol use: Yes    Comment: 12 drinks a wk  . Drug use: No  . Sexual activity: Never  Lifestyle  . Physical activity    Days per week: 3 days    Minutes per session: 60 min  . Stress: Not at all  Relationships  . Social Herbalist on phone: Once a week    Gets together: Never    Attends religious service: Never    Active member of club or organization: Yes    Attends meetings of clubs or organizations: 1 to 4  times per year    Relationship status: Married  Other Topics Concern  . Not on file  Social History Narrative   Plays golf during the week for 4 hours and walks the golf course.   Family History: No family history on file. Allergies: Allergies  Allergen Reactions  . Simvastatin Other (See Comments)    myalgia myalgia   Medications: See med rec.  Review of Systems: No fevers, chills, night sweats, weight loss, chest pain, or shortness of breath.   Objective:    General: Well Developed, well nourished, and in no acute distress.  Neuro: Alert and oriented x3, extra-ocular muscles intact, sensation grossly intact.  HEENT: Normocephalic, atraumatic, pupils equal round reactive to light, neck supple, no masses, no lymphadenopathy, thyroid nonpalpable.  Skin: Warm and dry, no rashes. Cardiac: Regular rate and rhythm, no murmurs rubs or gallops, no lower extremity edema.  Respiratory: Clear to auscultation bilaterally. Not using accessory muscles, speaking in full sentences. Neck: Negative spurling's Full neck range of motion Grip strength and sensation normal in bilateral hands Strength good C4 to T1 distribution No sensory change to C4 to T1 Reflexes normal Right shoulder: Inspection reveals no abnormalities, atrophy or asymmetry. Visibly swollen but nontender acromioclavicular joint. ROM is full in  all planes. Rotator cuff strength normal throughout. No signs of impingement with negative Neer and Hawkin's tests, empty can. Speeds and Yergason's tests normal. No labral pathology noted with negative Obrien's, negative crank, negative clunk, and good stability. Normal scapular function observed. No painful arc and no drop arm sign. No apprehension sign  Impression and Recommendations:    Cervical spondylosis Left-sided axial neck pain, nothing radicular. X-rays, home rehab, meloxicam. Return to see me in 4 to 6 weeks, MR for interventional planning if no better.   Arthrosis of right acromioclavicular joint Enlarged right acromioclavicular joint without pain, no further intervention needed, baseline x-rays.   ___________________________________________ Gwen Her. Dianah Field, M.D., ABFM., CAQSM. Primary Care and Sports Medicine [facility]  Adjunct Professor of [employer] [facility] [employer]

## 2018-11-29 NOTE — Assessment & Plan Note (Signed)
Enlarged right acromioclavicular joint without pain, no further intervention needed, baseline x-rays.

## 2018-11-29 NOTE — Assessment & Plan Note (Signed)
Left-sided axial neck pain, nothing radicular. X-rays, home rehab, meloxicam. Return to see me in 4 to 6 weeks, MR for interventional planning if no better.

## 2018-12-27 ENCOUNTER — Encounter: Payer: Self-pay | Admitting: Sports Medicine

## 2018-12-27 ENCOUNTER — Other Ambulatory Visit: Payer: Self-pay

## 2018-12-27 ENCOUNTER — Ambulatory Visit (INDEPENDENT_AMBULATORY_CARE_PROVIDER_SITE_OTHER): Payer: Medicare HMO | Admitting: Sports Medicine

## 2018-12-27 VITALS — BP 103/64 | HR 96 | Ht 69.0 in | Wt 174.0 lb

## 2018-12-27 DIAGNOSIS — M47812 Spondylosis without myelopathy or radiculopathy, cervical region: Secondary | ICD-10-CM | POA: Diagnosis not present

## 2018-12-27 DIAGNOSIS — Z23 Encounter for immunization: Secondary | ICD-10-CM | POA: Diagnosis not present

## 2018-12-27 NOTE — Patient Instructions (Signed)
Pneumococcal Vaccine, Polyvalent solution for injection What is this medicine? PNEUMOCOCCAL VACCINE, POLYVALENT (NEU mo KOK al vak SEEN, pol ee VEY luhnt) is a vaccine to prevent pneumococcus bacteria infection. These bacteria are a major cause of ear infections, Strep throat infections, and serious pneumonia, meningitis, or blood infections worldwide. These vaccines help the body to produce antibodies (protective substances) that help your body defend against these bacteria. This vaccine is recommended for people 2 years of age and older with health problems. It is also recommended for all adults over 50 years old. This vaccine will not treat an infection. This medicine may be used for other purposes; ask your health care provider or pharmacist if you have questions. COMMON BRAND NAME(S): Pneumovax 23 What should I tell my health care provider before I take this medicine? They need to know if you have any of these conditions:  bleeding problems  bone marrow or organ transplant  cancer, Hodgkin's disease  fever  infection  immune system problems  low platelet count in the blood  seizures  an unusual or allergic reaction to pneumococcal vaccine, diphtheria toxoid, other vaccines, latex, other medicines, foods, dyes, or preservatives  pregnant or trying to get pregnant  breast-feeding How should I use this medicine? This vaccine is for injection into a muscle or under the skin. It is given by a health care professional. A copy of Vaccine Information Statements will be given before each vaccination. Read this sheet carefully each time. The sheet may change frequently. Talk to your pediatrician regarding the use of this medicine in children. While this drug may be prescribed for children as young as 2 years of age for selected conditions, precautions do apply. Overdosage: If you think you have taken too much of this medicine contact a poison control center or emergency room at  once. NOTE: This medicine is only for you. Do not share this medicine with others. What if I miss a dose? It is important not to miss your dose. Call your doctor or health care professional if you are unable to keep an appointment. What may interact with this medicine?  medicines for cancer chemotherapy  medicines that suppress your immune function  medicines that treat or prevent blood clots like warfarin, enoxaparin, and dalteparin  steroid medicines like prednisone or cortisone This list may not describe all possible interactions. Give your health care provider a list of all the medicines, herbs, non-prescription drugs, or dietary supplements you use. Also tell them if you smoke, drink alcohol, or use illegal drugs. Some items may interact with your medicine. What should I watch for while using this medicine? Mild fever and pain should go away in 3 days or less. Report any unusual symptoms to your doctor or health care professional. What side effects may I notice from receiving this medicine? Side effects that you should report to your doctor or health care professional as soon as possible:  allergic reactions like skin rash, itching or hives, swelling of the face, lips, or tongue  breathing problems  confused  fever over 102 degrees F  pain, tingling, numbness in the hands or feet  seizures  unusual bleeding or bruising  unusual muscle weakness Side effects that usually do not require medical attention (report to your doctor or health care professional if they continue or are bothersome):  aches and pains  diarrhea  fever of 102 degrees F or less  headache  irritable  loss of appetite  pain, tender at site where injected    trouble sleeping This list may not describe all possible side effects. Call your doctor for medical advice about side effects. You may report side effects to FDA at 1-800-FDA-1088. Where should I keep my medicine? This does not apply. This  vaccine is given in a clinic, pharmacy, doctor's office, or other health care setting and will not be stored at home. NOTE: This sheet is a summary. It may not cover all possible information. If you have questions about this medicine, talk to your doctor, pharmacist, or health care provider.  2020 Elsevier/Gold Standard (2007-09-22 14:32:37)  

## 2018-12-27 NOTE — Addendum Note (Signed)
Addended by: Jamesetta So on: 12/27/2018 02:27 PM   Modules accepted: Orders

## 2018-12-27 NOTE — Assessment & Plan Note (Addendum)
Left-sided neck pain, nothing radicular, now resolved with meloxicam, home rehab exercises, return as needed. He tells me he has never had pneumococcal vaccination, adding pneumococcal 23 today, he will be done after this. He declines prescription for Shingrix vaccine.

## 2018-12-27 NOTE — Progress Notes (Signed)
  Subjective:    CC: Follow-up  HPI: David Maldonado returns, he is a pleasant 73 year old male, we treated him for cervical spondylosis, he doing much better now.  I reviewed the past medical history, family history, social history, surgical history, and allergies today and no changes were needed.  Please see the problem list section below in epic for further details.  Past Medical History: Past Medical History:  Diagnosis Date  . CHF (congestive heart failure) ([facility])   . Prediabetes 12/26/2017   Past Surgical History: Past Surgical History:  Procedure Laterality Date  . CYSTOSCOPY  2016  . HERNIA REPAIR     umbilical and R inguinal   Social History: Social History   Socioeconomic History  . Marital status: Married    Spouse name: Arbie Cookey  . Number of children: 2  . Years of education: 42  . Highest education level: 12th grade  Occupational History  . 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 Financial resource strain: Not hard at all  . Food insecurity    Worry: Never true    Inability: Never true  . Transportation needs    Medical: No    Non-medical: No  Tobacco Use  . Smoking status: Current Every Day Smoker    Packs/day: 0.50    Types: Cigars, Cigarettes  . Smokeless tobacco: Never Used  Substance and Sexual Activity  . Alcohol use: Yes    Comment: 12 drinks a wk  . Drug use: No  . Sexual activity: Never  Lifestyle  . Physical activity    Days per week: 3 days    Minutes per session: 60 min  . Stress: Not at all  Relationships  . Social Herbalist on phone: Once a week    Gets together: Never    Attends religious service: Never    Active member of club or organization: Yes    Attends meetings of clubs or organizations: 1 to 4 times per year    Relationship status: Married  Other Topics Concern  . Not on file  Social History Narrative   Plays golf during the week for 4 hours and walks the golf course.   Family History: No  family history on file. Allergies: Allergies  Allergen Reactions  . Simvastatin Other (See Comments)    myalgia myalgia   Medications: See med rec.  Review of Systems: No fevers, chills, night sweats, weight loss, chest pain, or shortness of breath.   Objective:    General: Well Developed, well nourished, and in no acute distress.  Neuro: Alert and oriented x3, extra-ocular muscles intact, sensation grossly intact.  HEENT: Normocephalic, atraumatic, pupils equal round reactive to light, neck supple, no masses, no lymphadenopathy, thyroid nonpalpable.  Skin: Warm and dry, no rashes. Cardiac: Regular rate and rhythm, no murmurs rubs or gallops, no lower extremity edema.  Respiratory: Clear to auscultation bilaterally. Not using accessory muscles, speaking in full sentences.  Impression and Recommendations:    Cervical spondylosis Left-sided neck pain, nothing radicular, now resolved with meloxicam, home rehab exercises, return as needed. He tells me he has never had pneumococcal vaccination, adding pneumococcal 23 today, he will be done after this. He declines prescription for Shingrix vaccine.   ___________________________________________ Gwen Her. Dianah Field, M.D., ABFM., CAQSM. Primary Care and Sports Medicine [facility]  Adjunct Professor of [employer] [facility] [employer]

## 2019-06-12 DIAGNOSIS — H269 Unspecified cataract: Secondary | ICD-10-CM | POA: Diagnosis not present

## 2019-06-12 DIAGNOSIS — H5213 Myopia, bilateral: Secondary | ICD-10-CM | POA: Diagnosis not present

## 2019-06-12 DIAGNOSIS — H524 Presbyopia: Secondary | ICD-10-CM | POA: Diagnosis not present

## 2019-08-02 ENCOUNTER — Telehealth: Payer: Self-pay | Admitting: Osteopathic Medicine

## 2019-08-02 NOTE — Telephone Encounter (Signed)
David Maldonado is under the impression that Dr. Darene Lamer is "taking over his PCP care " I tried to explain in great detail that Dr. Sheppard Coil is his PCP and he sees Dr.T only for sports medicine.  The issue came up when scheduling him a medication follow up. He denied multiple times to be scheduled with Dr. Sheppard Coil. Even prompting questions such as, " Are you denying my choice of care?!" I ended up putting him on the schedule with T to get off the phone.  Can I please get a confirmation he only should be with Dr. Sheppard Coil so I can move him over.   Please advise....Marland KitchenMarland Kitchen

## 2019-08-03 NOTE — Telephone Encounter (Signed)
update

## 2019-08-03 NOTE — Telephone Encounter (Signed)
FYI / ok?

## 2019-08-03 NOTE — Telephone Encounter (Signed)
Thank you for the clarification.

## 2019-08-03 NOTE — Telephone Encounter (Addendum)
Maybe Dr. Zigmund Daniel?  Sounds like he might want a male provider.  Emphasize to him that I am happy to do his orthopedic conditions and this includes his pain medication but primary care is out.

## 2019-08-03 NOTE — Telephone Encounter (Signed)
Sounds like he needs a new PCP Routing to Dr T in case he has other ideas

## 2019-08-06 NOTE — Telephone Encounter (Signed)
Ok with me 

## 2019-08-07 ENCOUNTER — Encounter: Payer: Self-pay | Admitting: Family Medicine

## 2019-08-07 ENCOUNTER — Other Ambulatory Visit: Payer: Self-pay

## 2019-08-07 ENCOUNTER — Ambulatory Visit (INDEPENDENT_AMBULATORY_CARE_PROVIDER_SITE_OTHER): Payer: Medicare HMO | Admitting: Family Medicine

## 2019-08-07 VITALS — BP 115/78 | HR 67 | Ht 68.9 in | Wt 173.3 lb

## 2019-08-07 DIAGNOSIS — N401 Enlarged prostate with lower urinary tract symptoms: Secondary | ICD-10-CM

## 2019-08-07 DIAGNOSIS — L918 Other hypertrophic disorders of the skin: Secondary | ICD-10-CM | POA: Diagnosis not present

## 2019-08-07 DIAGNOSIS — R3912 Poor urinary stream: Secondary | ICD-10-CM

## 2019-08-07 DIAGNOSIS — I509 Heart failure, unspecified: Secondary | ICD-10-CM | POA: Diagnosis not present

## 2019-08-07 DIAGNOSIS — I10 Essential (primary) hypertension: Secondary | ICD-10-CM | POA: Diagnosis not present

## 2019-08-07 DIAGNOSIS — L821 Other seborrheic keratosis: Secondary | ICD-10-CM | POA: Diagnosis not present

## 2019-08-07 DIAGNOSIS — I110 Hypertensive heart disease with heart failure: Secondary | ICD-10-CM | POA: Diagnosis not present

## 2019-08-07 DIAGNOSIS — E782 Mixed hyperlipidemia: Secondary | ICD-10-CM

## 2019-08-07 MED ORDER — TAMSULOSIN HCL 0.4 MG PO CAPS: 0.4000 mg | ORAL_CAPSULE | Freq: Two times a day (BID) | ORAL | 2 refills | Status: DC

## 2019-08-07 MED ORDER — LISINOPRIL 20 MG PO TABS: 20.0000 mg | ORAL_TABLET | Freq: Every day | ORAL | 3 refills | Status: DC

## 2019-08-07 MED ORDER — PRAVASTATIN SODIUM 40 MG PO TABS: 40.0000 mg | ORAL_TABLET | Freq: Every day | ORAL | 3 refills | Status: DC

## 2019-08-07 NOTE — Assessment & Plan Note (Signed)
Tolerating pravastatin well Update Lipid panel

## 2019-08-07 NOTE — Assessment & Plan Note (Signed)
Stable with current medications.  Update PSA. °

## 2019-08-07 NOTE — Assessment & Plan Note (Signed)
Treated with liquid nitrogen.  See procedure note.

## 2019-08-07 NOTE — Patient Instructions (Addendum)
Great to meet you today! Please have labs completed, we'll be in touch with results and recommendations See me again in 6 months.    Cryosurgery for Skin Conditions, Care After These instructions give you information on caring for yourself after your procedure. Your doctor may also give you more specific instructions. Call your doctor if you have any problems or questions after your procedure. Follow these instructions at home: Caring for the treated area   Follow instructions from your doctor about how to take care of the treated area. Make sure you: ? Keep the area covered with a bandage (dressing) until it heals, or for as long as told by your doctor. ? Wash your hands with soap and water before you change your bandage. If you do not have soap and water, use hand sanitizer. ? Change your bandage as told by your doctor. ? Keep the bandage and the treated area clean and dry. If the bandage gets wet, change it right away. ? Clean the treated area with soap and water.  Check the treated area every day for signs of infection. Check for: ? More redness, swelling, or pain. ? More fluid or blood. ? Warmth. ? Pus or a bad smell. General instructions  Do not pick at your blister. Do not try to break it open. This can cause infection and scarring.  Do not put any medicine, cream, or lotion on the treated area unless told by your doctor.  Take over-the-counter and prescription medicines only as told by your doctor.  Keep all follow-up visits as told by your doctor. This is important. Contact a doctor if:  You have more redness, swelling, or pain around the treated area.  You have more fluid or blood coming from the treated area.  The treated area feels warm to the touch.  You have pus or a bad smell coming from the treated area.  Your blister gets large and painful. Get help right away if:  You have a fever and have redness spreading from the treated area. Summary  You should  keep the treated area and your bandage clean and dry.  Check the treated area every day for signs of infection. Signs include fluid, pus, warmth, or having more redness, swelling, or pain.  Do not pick at your blister. Do not try to break it open. This information is not intended to replace advice given to you by your health care provider. Make sure you discuss any questions you have with your health care provider. Document Revised: 01/28/2017 Document Reviewed: 01/05/2016 Elsevier Patient Education  2020 [employer].

## 2019-08-07 NOTE — Assessment & Plan Note (Signed)
Treated with liquid nitrogen today.  See procedure note.  

## 2019-08-07 NOTE — Progress Notes (Signed)
David Maldonado - 74 y.o. male MRN [id number]  Date of birth: [birth year]  Subjective No chief complaint on file.   HPI David Maldonado is a 74 y.o. male with history of HTN, HLD, and BPH.  He is also seeing Dr. Darene Lamer for neck and shoulder pain and sees the [place] for annual care.  He is here today for follow up visit and has some skin lesions he would like treated.    -HTN:  Current management with lisinopril.  He is doing well with this and denies side effects related to medication. BP remains well controlled.  He has not had chest pain, shortness of breath, palpitations, headache or vision changes.   -HLD:  He is currently taking pravastatin.  He denies side effects from this including myalgias.  Due for updated lipid panel.   -Skin lesion:  Has skin lesions on upper chest wall and on back that he would like removed.  Denies pain or significant size change.    -BPH:  LUT's well managed with finasteride and tamsulosin.    ROS:  A comprehensive ROS was completed and negative except as noted per HPI    Allergies  Allergen Reactions  . Simvastatin Other (See Comments)    myalgia myalgia    Past Medical History:  Diagnosis Date  . CHF (congestive heart failure) ([place])   . Prediabetes 12/26/2017    Past Surgical History:  Procedure Laterality Date  . CYSTOSCOPY  2016  . HERNIA REPAIR     umbilical and R inguinal    Social History   Socioeconomic History  . Marital status: Married    Spouse name: Arbie Cookey  . Number of children: 2  . Years of education: 64  . Highest education level: 12th grade  Occupational History  . Occupation: retired    Comment: truck Geophysicist/field seismologist  Tobacco Use  . Smoking status: Current Every Day Smoker    Packs/day: 0.50    Types: Cigars, Cigarettes  . Smokeless tobacco: Never Used  Substance and Sexual Activity  . Alcohol use: Yes    Comment: 12 drinks a wk  . Drug use: No  . Sexual activity: Never  Other Topics Concern  . Not on file  Social History  Narrative   Plays golf during the week for 4 hours and walks the golf course.   Social Determinants of Health   Financial Resource Strain:   . Difficulty of Paying Living Expenses:   Food Insecurity:   . Worried About Charity fundraiser in the Last Year:   . Arboriculturist in the Last Year:   Transportation Needs:   . Film/video editor (Medical):   Marland Kitchen Lack of Transportation (Non-Medical):   Physical Activity:   . Days of Exercise per Week:   . Minutes of Exercise per Session:   Stress:   . Feeling of Stress :   Social Connections: Unknown  . Frequency of Communication with Friends and Family: Once a week  . Frequency of Social Gatherings with Friends and Family: Never  . Attends Religious Services: Not on file  . Active Member of Clubs or Organizations: Not on file  . Attends Archivist Meetings: Not on file  . Marital Status: Not on file    History reviewed. No pertinent family history.  Health Maintenance  Topic Date Due  . Hepatitis C Screening  Never done  . TETANUS/TDAP  03/02/2019  . INFLUENZA VACCINE  09/30/2019  . PNA vac Low  Risk Adult (2 of 2 - PCV13) 12/27/2019  . COLONOSCOPY  08/16/2028  . COVID-19 Vaccine  Completed     ----------------------------------------------------------------------------------------------------------------------------------------------------------------------------------------------------------------- Physical Exam BP 115/78 (BP Location: Left Arm, Patient Position: Sitting, Cuff Size: Normal)   Pulse 67   Ht 5' 8.9" (1.75 m)   Wt 173 lb 4.8 oz (78.6 kg)   SpO2 97%   BMI 25.67 kg/m   Physical Exam Constitutional:      Appearance: Normal appearance.  HENT:     Head: Normocephalic and atraumatic.  Cardiovascular:     Rate and Rhythm: Normal rate and regular rhythm.  Pulmonary:     Effort: Pulmonary effort is normal.     Breath sounds: Normal breath sounds.  Musculoskeletal:     Cervical back: Neck supple.   Skin:    Comments: Slightly raised, pigmented lesion to R lower neck area consistent with SK.   Pedunculated, flesh colored,  somewhat verrucous appearing lesion go R mid back.   Neurological:     General: No focal deficit present.     Mental Status: He is alert.  Psychiatric:        Mood and Affect: Mood normal.        Behavior: Behavior normal.   Procedure:  Procedure explained in detail to patient including potential complications and adverse outcomes including poor healing, infection, hypopigmentation and excess bleeding.  He elects to proceed with procedure.  Area on R lower neck treated with liquid nitrogen utilize freeze/thaw cycle x2 with 22mm frost ring with each freeze cycle.  The same protocol was utilized for area on the back as well. He tolerated procedure well.  Post procedure instructions given.   ------------------------------------------------------------------------------------------------------------------------------------------------------------------------------------------------------------------- Assessment and Plan  Essential hypertension Blood pressure is at goal at for age and co-morbidities.  I recommend continuation of lisinopril.  In addition they were instructed to follow a low sodium diet with regular exercise to help to maintain adequate control of blood pressure.    Benign prostatic hyperplasia with weak urinary stream Stable with current medications Update PSA  Hyperlipidemia Tolerating pravastatin well Update Lipid panel  Acrochordon Treated with liquid nitrogen.  See procedure note.   SK (seborrheic keratosis) Treated with liquid nitrogen today.  See procedure note.    Meds ordered this encounter  Medications  . lisinopril (ZESTRIL) 20 MG tablet    Sig: Take 1 tablet (20 mg total) by mouth daily.    Dispense:  90 tablet    Refill:  3  . pravastatin (PRAVACHOL) 40 MG tablet    Sig: Take 1 tablet (40 mg total) by mouth daily.     Dispense:  90 tablet    Refill:  3    Return in about 6 months (around 02/06/2020) for HTN.    This visit occurred during the SARS-CoV-2 public health emergency.  Safety protocols were in place, including screening questions prior to the visit, additional usage of staff PPE, and extensive cleaning of exam room while observing appropriate contact time as indicated for disinfecting solutions.

## 2019-08-07 NOTE — Telephone Encounter (Signed)
Appt scheduled

## 2019-08-07 NOTE — Assessment & Plan Note (Signed)
Blood pressure is at goal at for age and co-morbidities.  I recommend continuation of lisinopril.  In addition they were instructed to follow a low sodium diet with regular exercise to help to maintain adequate control of blood pressure.

## 2019-08-10 ENCOUNTER — Ambulatory Visit: Payer: Medicare HMO | Admitting: Sports Medicine

## 2019-08-14 DIAGNOSIS — N401 Enlarged prostate with lower urinary tract symptoms: Secondary | ICD-10-CM | POA: Diagnosis not present

## 2019-08-14 DIAGNOSIS — R3912 Poor urinary stream: Secondary | ICD-10-CM | POA: Diagnosis not present

## 2019-08-14 DIAGNOSIS — I10 Essential (primary) hypertension: Secondary | ICD-10-CM | POA: Diagnosis not present

## 2019-08-14 DIAGNOSIS — E782 Mixed hyperlipidemia: Secondary | ICD-10-CM | POA: Diagnosis not present

## 2019-08-15 LAB — COMPLETE METABOLIC PANEL WITH GFR
AG Ratio: 1.6 (calc) (ref 1.0–2.5)
ALT: 17 U/L (ref 9–46)
AST: 19 U/L (ref 10–35)
Albumin: 4.2 g/dL (ref 3.6–5.1)
Alkaline phosphatase (APISO): 51 U/L (ref 35–144)
BUN: 15 mg/dL (ref 7–25)
CO2: 27 mmol/L (ref 20–32)
Calcium: 9.2 mg/dL (ref 8.6–10.3)
Chloride: 107 mmol/L (ref 98–110)
Creat: 0.86 mg/dL (ref 0.70–1.18)
GFR, Est African American: 100 mL/min/{1.73_m2} (ref >=60)
GFR, Est Non African American: 87 mL/min/{1.73_m2} (ref >=60)
Globulin: 2.6 g/dL (calc) (ref 1.9–3.7)
Glucose, Bld: 108 mg/dL — ABNORMAL HIGH (ref 65–99)
Potassium: 4.9 mmol/L (ref 3.5–5.3)
Sodium: 137 mmol/L (ref 135–146)
Total Bilirubin: 0.7 mg/dL (ref 0.2–1.2)
Total Protein: 6.8 g/dL (ref 6.1–8.1)

## 2019-08-15 LAB — PSA, TOTAL AND FREE
PSA, % Free: UNDETERMINED % (calc) (ref >25)
PSA, Free: <0.1 ng/mL
PSA, Total: 0.3 ng/mL (ref <=4.0)

## 2019-08-15 LAB — CBC
HCT: 40.8 % (ref 38.5–50.0)
Hemoglobin: 13.9 g/dL (ref 13.2–17.1)
MCH: 32.5 pg (ref 27.0–33.0)
MCHC: 34.1 g/dL (ref 32.0–36.0)
MCV: 95.3 fL (ref 80.0–100.0)
MPV: 9.3 fL (ref 7.5–12.5)
Platelets: 292 10*3/uL (ref 140–400)
RBC: 4.28 10*6/uL (ref 4.20–5.80)
RDW: 12.4 % (ref 11.0–15.0)
WBC: 5.7 10*3/uL (ref 3.8–10.8)

## 2019-08-15 LAB — LIPID PANEL
Cholesterol: 184 mg/dL (ref <200)
HDL: 45 mg/dL (ref >=40)
LDL Cholesterol (Calc): 102 mg/dL (calc) — ABNORMAL HIGH
Non-HDL Cholesterol (Calc): 139 mg/dL (calc) — ABNORMAL HIGH (ref <130)
Total CHOL/HDL Ratio: 4.1 (calc) (ref <5.0)
Triglycerides: 243 mg/dL — ABNORMAL HIGH (ref <150)

## 2019-10-23 ENCOUNTER — Telehealth: Payer: Self-pay | Admitting: Sports Medicine

## 2019-10-23 DIAGNOSIS — N401 Enlarged prostate with lower urinary tract symptoms: Secondary | ICD-10-CM

## 2019-10-23 NOTE — Telephone Encounter (Signed)
Needs to establish with another PCP here, looks like he has seen both Dr. Sheppard Coil and Dr. Georgina Snell.  I have been doing his orthopedic stuff.

## 2019-10-24 NOTE — Telephone Encounter (Signed)
Established with Dr. Zigmund Daniel.  Please advise.

## 2019-10-24 NOTE — Telephone Encounter (Signed)
Sent message to [facility] for clarification. Waiting for response.

## 2019-10-30 NOTE — Telephone Encounter (Signed)
Pt was requesting tadalafil rx refill. Per Dr. Darene Lamer - pt needs to establish care with a new provider (last PCP was Dr. Georgina Snell) since he only deals with pt's orthopedic issues.

## 2019-11-03 ENCOUNTER — Other Ambulatory Visit: Payer: Self-pay | Admitting: Sports Medicine

## 2019-11-03 DIAGNOSIS — R3912 Poor urinary stream: Secondary | ICD-10-CM

## 2019-11-05 NOTE — Telephone Encounter (Signed)
To PCP

## 2019-11-20 ENCOUNTER — Ambulatory Visit (INDEPENDENT_AMBULATORY_CARE_PROVIDER_SITE_OTHER): Payer: Medicare HMO | Admitting: Family Medicine

## 2019-11-20 ENCOUNTER — Other Ambulatory Visit: Payer: Self-pay

## 2019-11-20 ENCOUNTER — Encounter: Payer: Self-pay | Admitting: Family Medicine

## 2019-11-20 VITALS — BP 136/80 | HR 78 | Wt 176.2 lb

## 2019-11-20 DIAGNOSIS — M79674 Pain in right toe(s): Secondary | ICD-10-CM

## 2019-11-20 DIAGNOSIS — M7989 Other specified soft tissue disorders: Secondary | ICD-10-CM | POA: Diagnosis not present

## 2019-11-20 LAB — URIC ACID: Uric Acid, Serum: 6 mg/dL (ref 4.0–8.0)

## 2019-11-20 MED ORDER — INDOMETHACIN 50 MG PO CAPS: 50.0000 mg | ORAL_CAPSULE | Freq: Three times a day (TID) | ORAL | 1 refills | Status: AC

## 2019-11-20 NOTE — Patient Instructions (Signed)
 Gout  Gout is painful swelling of your joints. Gout is a type of arthritis. It is caused by having too much uric acid in your body. Uric acid is a chemical that is made when your body breaks down substances called purines. If your body has too much uric acid, sharp crystals can form and build up in your joints. This causes pain and swelling. Gout attacks can happen quickly and be very painful (acute gout). Over time, the attacks can affect more joints and happen more often (chronic gout). What are the causes?  Too much uric acid in your blood. This can happen because: ? Your kidneys do not remove enough uric acid from your blood. ? Your body makes too much uric acid. ? You eat too many foods that are high in purines. These foods include organ meats, some seafood, and beer.  Trauma or stress. What increases the risk?  Having a family history of gout.  Being male and middle-aged.  Being male and having gone through menopause.  Being very overweight (obese).  Drinking alcohol, especially beer.  Not having enough water in the body (being dehydrated).  Losing weight too quickly.  Having an organ transplant.  Having lead poisoning.  Taking certain medicines.  Having kidney disease.  Having a skin condition called psoriasis. What are the signs or symptoms? An attack of acute gout usually happens in just one joint. The most common place is the big toe. Attacks often start at night. Other joints that may be affected include joints of the feet, ankle, knee, fingers, wrist, or elbow. Symptoms of an attack may include:  Very bad pain.  Warmth.  Swelling.  Stiffness.  Shiny, red, or purple skin.  Tenderness. The affected joint may be very painful to touch.  Chills and fever. Chronic gout may cause symptoms more often. More joints may be involved. You may also have white or yellow lumps (tophi) on your hands or feet or in other areas near your joints. How is this  treated?  Treatment for this condition has two phases: treating an acute attack and preventing future attacks.  Acute gout treatment may include: ? NSAIDs. ? Steroids. These are taken by mouth or injected into a joint. ? Colchicine. This medicine relieves pain and swelling. It can be given by mouth or through an IV tube.  Preventive treatment may include: ? Taking small doses of NSAIDs or colchicine daily. ? Using a medicine that reduces uric acid levels in your blood. ? Making changes to your diet. You may need to see a food expert (dietitian) about what to eat and drink to prevent gout. Follow these instructions at home: During a gout attack   If told, put ice on the painful area: ? Put ice in a plastic bag. ? Place a towel between your skin and the bag. ? Leave the ice on for 20 minutes, 2-3 times a day.  Raise (elevate) the painful joint above the level of your heart as often as you can.  Rest the joint as much as possible. If the joint is in your leg, you may be given crutches.  Follow instructions from your doctor about what you cannot eat or drink. Avoiding future gout attacks  Eat a low-purine diet. Avoid foods and drinks such as: ? Liver. ? Kidney. ? Anchovies. ? Asparagus. ? Herring. ? Mushrooms. ? Mussels. ? Beer.  Stay at a healthy weight. If you want to lose weight, talk with your doctor. Do not lose   weight too fast.  Start or continue an exercise plan as told by your doctor. Eating and drinking  Drink enough fluids to keep your pee (urine) pale yellow.  If you drink alcohol: ? Limit how much you use to:  0-1 drink a day for women.  0-2 drinks a day for men. ? Be aware of how much alcohol is in your drink. In the [place] one drink equals one 12 oz bottle of beer (355 mL), one 5 oz glass of wine (148 mL), or one 1 oz glass of hard liquor (44 mL). General instructions  Take over-the-counter and prescription medicines only as told by your doctor.  Do  not drive or use heavy machinery while taking prescription pain medicine.  Return to your normal activities as told by your doctor. Ask your doctor what activities are safe for you.  Keep all follow-up visits as told by your doctor. This is important. Contact a doctor if:  You have another gout attack.  You still have symptoms of a gout attack after 10 days of treatment.  You have problems (side effects) because of your medicines.  You have chills or a fever.  You have burning pain when you pee (urinate).  You have pain in your lower back or belly. Get help right away if:  You have very bad pain.  Your pain cannot be controlled.  You cannot pee. Summary  Gout is painful swelling of the joints.  The most common site of pain is the big toe, but it can affect other joints.  Medicines and avoiding some foods can help to prevent and treat gout attacks. This information is not intended to replace advice given to you by your health care provider. Make sure you discuss any questions you have with your health care provider. Document Revised: 09/07/2017 Document Reviewed: 09/07/2017 Elsevier Patient Education  2020 [employer]  

## 2019-11-20 NOTE — Assessment & Plan Note (Addendum)
Appearance of toe consistent with gout however had previous aspiration negative for crystals.  May be related to OA.  Will check serum uric acid levels and start indomethacin which will treat gouty arthritis or OA.  Instructed to contact clinic if his symptoms fail to improve with treatment.

## 2019-11-20 NOTE — Progress Notes (Signed)
David Maldonado - 74 y.o. male MRN [id number]  Date of birth: 1945-12-28  Subjective Chief Complaint  Patient presents with  . [place]    HPI David Maldonado is a 74 y.o. male here today with complaint of toe pain.  Pain is located in great toe of R foot.  He does have warmth and redness. Symptoms started 3 days ago.  Denies any injury or overuse.  He had visited a winery prior to onset.  He did have similar episode but in more distal toe last year.  Had joint aspiration by Dr. Darene Lamer.  Fluid negative for crystals.  He denies fever or chills.    ROS:  A comprehensive ROS was completed and negative except as noted per HPI  Allergies  Allergen Reactions  . Simvastatin Other (See Comments)    myalgia myalgia    Past Medical History:  Diagnosis Date  . CHF (congestive heart failure) ([place])   . Prediabetes 12/26/2017    Past Surgical History:  Procedure Laterality Date  . CYSTOSCOPY  2016  . HERNIA REPAIR     umbilical and R inguinal    Social History   Socioeconomic History  . Marital status: Married    Spouse name: Arbie Cookey  . Number of children: 2  . Years of education: 80  . Highest education level: 12th grade  Occupational History  . Occupation: retired    Comment: truck Geophysicist/field seismologist  Tobacco Use  . Smoking status: Current Every Day Smoker    Packs/day: 0.50    Types: Cigars, Cigarettes  . Smokeless tobacco: Never Used  Vaping Use  . Vaping Use: Never used  Substance and Sexual Activity  . Alcohol use: Yes    Comment: 12 drinks a wk  . Drug use: No  . Sexual activity: Never  Other Topics Concern  . Not on file  Social History Narrative   Plays golf during the week for 4 hours and walks the golf course.   Social Determinants of Health   Financial Resource Strain:   . Difficulty of Paying Living Expenses: Not on file  Food Insecurity:   . Worried About Charity fundraiser in the Last Year: Not on file  . Ran Out of Food in the Last Year: Not on file  Transportation  Needs:   . Lack of Transportation (Medical): Not on file  . Lack of Transportation (Non-Medical): Not on file  Physical Activity:   . Days of Exercise per Week: Not on file  . Minutes of Exercise per Session: Not on file  Stress:   . Feeling of Stress : Not on file  Social Connections: Unknown  . Frequency of Communication with Friends and Family: Once a week  . Frequency of Social Gatherings with Friends and Family: Never  . Attends Religious Services: Not on file  . Active Member of Clubs or Organizations: Not on file  . Attends Archivist Meetings: Not on file  . Marital Status: Not on file    History reviewed. No pertinent family history.  Health Maintenance  Topic Date Due  . Hepatitis C Screening  Never done  . TETANUS/TDAP  03/02/2019  . INFLUENZA VACCINE  09/30/2019  . PNA vac Low Risk Adult (2 of 2 - PCV13) 12/27/2019  . COLONOSCOPY  08/16/2028  . COVID-19 Vaccine  Completed     ----------------------------------------------------------------------------------------------------------------------------------------------------------------------------------------------------------------- Physical Exam BP 136/80 (BP Location: Left Arm, Patient Position: Sitting, Cuff Size: Normal)   Pulse 78   Wt 176  lb 3.2 oz (79.9 kg)   SpO2 98%   BMI 26.10 kg/m   Physical Exam Constitutional:      Appearance: Normal appearance.  Cardiovascular:     Rate and Rhythm: Normal rate and regular rhythm.  Pulmonary:     Effort: Pulmonary effort is normal.     Breath sounds: Normal breath sounds.  Musculoskeletal:     Comments: MTP joint of R great toe with swelling, redness, and warmth.  TTP.    Neurological:     Mental Status: He is alert.     ------------------------------------------------------------------------------------------------------------------------------------------------------------------------------------------------------------------- Assessment and  Plan  Toe pain, right Appearance of toe consistent with gout however had previous aspiration negative for crystals.  May be related to OA.  Will check serum uric acid levels and start indomethacin which will treat gouty arthritis or OA.  Instructed to contact clinic if his symptoms fail to improve with treatment.    Meds ordered this encounter  Medications  . indomethacin (INDOCIN) 50 MG capsule    Sig: Take 1 capsule (50 mg total) by mouth 3 (three) times daily with meals. Take until symptoms improve.    Dispense:  30 capsule    Refill:  1    No follow-ups on file.    This visit occurred during the SARS-CoV-2 public health emergency.  Safety protocols were in place, including screening questions prior to the visit, additional usage of staff PPE, and extensive cleaning of exam room while observing appropriate contact time as indicated for disinfecting solutions.

## 2019-12-03 ENCOUNTER — Ambulatory Visit: Payer: Medicare HMO

## 2020-02-06 ENCOUNTER — Ambulatory Visit (INDEPENDENT_AMBULATORY_CARE_PROVIDER_SITE_OTHER): Payer: Medicare HMO | Admitting: Family Medicine

## 2020-02-06 ENCOUNTER — Encounter: Payer: Self-pay | Admitting: Family Medicine

## 2020-02-06 ENCOUNTER — Other Ambulatory Visit: Payer: Self-pay

## 2020-02-06 VITALS — BP 120/66 | HR 65 | Wt 171.0 lb

## 2020-02-06 DIAGNOSIS — R3912 Poor urinary stream: Secondary | ICD-10-CM | POA: Diagnosis not present

## 2020-02-06 DIAGNOSIS — I10 Essential (primary) hypertension: Secondary | ICD-10-CM

## 2020-02-06 DIAGNOSIS — N401 Enlarged prostate with lower urinary tract symptoms: Secondary | ICD-10-CM | POA: Diagnosis not present

## 2020-02-06 DIAGNOSIS — E782 Mixed hyperlipidemia: Secondary | ICD-10-CM | POA: Diagnosis not present

## 2020-02-06 DIAGNOSIS — I700 Atherosclerosis of aorta: Secondary | ICD-10-CM

## 2020-02-06 DIAGNOSIS — Z23 Encounter for immunization: Secondary | ICD-10-CM | POA: Diagnosis not present

## 2020-02-06 DIAGNOSIS — I739 Peripheral vascular disease, unspecified: Secondary | ICD-10-CM | POA: Diagnosis not present

## 2020-02-06 MED ORDER — FINASTERIDE 5 MG PO TABS
5.0000 mg | ORAL_TABLET | Freq: Every day | ORAL | 2 refills | Status: DC
Start: 2020-02-06 — End: 2020-12-08

## 2020-02-06 NOTE — Progress Notes (Signed)
David Maldonado - 74 y.o. male MRN [id number]  Date of birth: [birth year]  Subjective Chief Complaint  Patient presents with  . Hypertension    HPI David Maldonado is a 74 y.o. male here today for follow up visit.  He has history of HTN, HLD, PAD, BPH.  He reports that overall he is doing well.  He had gout flare in his toe a couple of months ago which has resolved.    HTN has remained well controlled with lisinopril 20mg  daily.  He denies side effects related to current medication.  He has not had symptom of chest pain, shortness of breath, palpitations, headache or vision change.  He does continue to smoke a couple of cigarettes each day.  He declines medication to help with quitting.   Cholesterol is managed with pravastatin.  He is tolerating this well without myalgias.  He does have PAD but denies significant claudication symptoms.  Noted to have aortic atherosclerosis on CT at [facility] in 09/2016.  No aneurysm noted.   BPH with LUT's is managed with tamsulosin and finasteride.  This continues to work well for him. He was seeing urology but his provider left and he hasn't followed up since last year.    ROS:  A comprehensive ROS was completed and negative except as noted per HPI   Allergies  Allergen Reactions  . Simvastatin Other (See Comments)    myalgia myalgia    Past Medical History:  Diagnosis Date  . CHF (congestive heart failure) ([place])   . Prediabetes 12/26/2017    Past Surgical History:  Procedure Laterality Date  . CYSTOSCOPY  2016  . HERNIA REPAIR     umbilical and R inguinal    Social History   Socioeconomic History  . Marital status: Married    Spouse name: David Maldonado  . Number of children: 2  . Years of education: 48  . Highest education level: 12th grade  Occupational History  . Occupation: retired    Comment: truck Geophysicist/field seismologist  Tobacco Use  . Smoking status: Current Every Day Smoker    Packs/day: 0.50    Types: Cigars, Cigarettes  . Smokeless tobacco:  Never Used  Vaping Use  . Vaping Use: Never used  Substance and Sexual Activity  . Alcohol use: Yes    Comment: 12 drinks a wk  . Drug use: No  . Sexual activity: Never  Other Topics Concern  . Not on file  Social History Narrative   Plays golf during the week for 4 hours and walks the golf course.   Social Determinants of Health   Financial Resource Strain:   . Difficulty of Paying Living Expenses: Not on file  Food Insecurity:   . Worried About Charity fundraiser in the Last Year: Not on file  . Ran Out of Food in the Last Year: Not on file  Transportation Needs:   . Lack of Transportation (Medical): Not on file  . Lack of Transportation (Non-Medical): Not on file  Physical Activity:   . Days of Exercise per Week: Not on file  . Minutes of Exercise per Session: Not on file  Stress:   . Feeling of Stress : Not on file  Social Connections:   . Frequency of Communication with Friends and Family: Not on file  . Frequency of Social Gatherings with Friends and Family: Not on file  . Attends Religious Services: Not on file  . Active Member of Clubs or Organizations: Not on file  .  Attends Archivist Meetings: Not on file  . Marital Status: Not on file    History reviewed. No pertinent family history.  Health Maintenance  Topic Date Due  . Hepatitis C Screening  Never done  . INFLUENZA VACCINE  05/29/2020 (Originally 09/30/2019)  . TETANUS/TDAP  02/05/2021 (Originally 03/02/2019)  . COLONOSCOPY  08/16/2028  . COVID-19 Vaccine  Completed  . PNA vac Low Risk Adult  Completed     ----------------------------------------------------------------------------------------------------------------------------------------------------------------------------------------------------------------- Physical Exam BP 120/66 (BP Location: Left Arm, Patient Position: Sitting, Cuff Size: Normal)   Pulse 65   Wt 171 lb (77.6 kg)   SpO2 99%   BMI 25.33 kg/m   Physical  Exam Constitutional:      Appearance: Normal appearance.  HENT:     Head: Normocephalic and atraumatic.  Eyes:     General: No scleral icterus. Cardiovascular:     Rate and Rhythm: Normal rate and regular rhythm.  Pulmonary:     Effort: Pulmonary effort is normal.     Breath sounds: Normal breath sounds.  Musculoskeletal:     Cervical back: Neck supple.  Neurological:     General: No focal deficit present.     Mental Status: He is alert.  Psychiatric:        Mood and Affect: Mood normal.        Behavior: Behavior normal.     ------------------------------------------------------------------------------------------------------------------------------------------------------------------------------------------------------------------- Assessment and Plan  Essential hypertension Blood pressure is at goal at for age and co-morbidities.  I recommend continuation of lisinopril at 20mg  daily.  In addition they were instructed to follow a low sodium diet with regular exercise to help to maintain adequate control of blood pressure.    Peripheral artery disease Denies claudication symptoms.  Counseled on smoking cessation.   Aortic atherosclerosis ([place]) Noted on CT abdomen in 2018.  Continue pravastatin Recommended to quit smoking  Benign prostatic hyperplasia with weak urinary stream He has seen urology previously. PSA levels have been stable.  Continue finasteride and tamsulosin.   Hyperlipidemia Tolerating pravastatin well, continue at current strength.    Meds ordered this encounter  Medications  . finasteride (PROSCAR) 5 MG tablet    Sig: Take 1 tablet (5 mg total) by mouth daily.    Dispense:  90 tablet    Refill:  2    Return in about 6 months (around 08/06/2020) for HTN/HLD.    This visit occurred during the SARS-CoV-2 public health emergency.  Safety protocols were in place, including screening questions prior to the visit, additional usage of staff PPE, and  extensive cleaning of exam room while observing appropriate contact time as indicated for disinfecting solutions.

## 2020-02-06 NOTE — Patient Instructions (Signed)
Great to see you today! Please follow up with me in about 6 months.  We'll update fasting labs at this visit.

## 2020-02-06 NOTE — Assessment & Plan Note (Signed)
Tolerating pravastatin well, continue at current strength.

## 2020-02-06 NOTE — Assessment & Plan Note (Signed)
Denies claudication symptoms.  Counseled on smoking cessation.

## 2020-02-06 NOTE — Assessment & Plan Note (Signed)
Noted on CT abdomen in 2018.  Continue pravastatin Recommended to quit smoking

## 2020-02-06 NOTE — Assessment & Plan Note (Signed)
He has seen urology previously. PSA levels have been stable.  Continue finasteride and tamsulosin.

## 2020-02-06 NOTE — Assessment & Plan Note (Signed)
Blood pressure is at goal at for age and co-morbidities.  I recommend continuation of lisinopril at 20mg  daily.  In addition they were instructed to follow a low sodium diet with regular exercise to help to maintain adequate control of blood pressure.

## 2020-03-05 ENCOUNTER — Other Ambulatory Visit: Payer: Self-pay

## 2020-03-05 DIAGNOSIS — N401 Enlarged prostate with lower urinary tract symptoms: Secondary | ICD-10-CM

## 2020-03-05 IMAGING — DX DG CERVICAL SPINE COMPLETE 4+V
5 series · 5 of 5 positions shown · non-contrast
Comparison: Radiographs February 04, 2017.

CLINICAL DATA: Left-sided neck pain for 2 weeks without known
injury.

EXAM:
CERVICAL SPINE - COMPLETE 4+ VIEW

[c-spine lat]
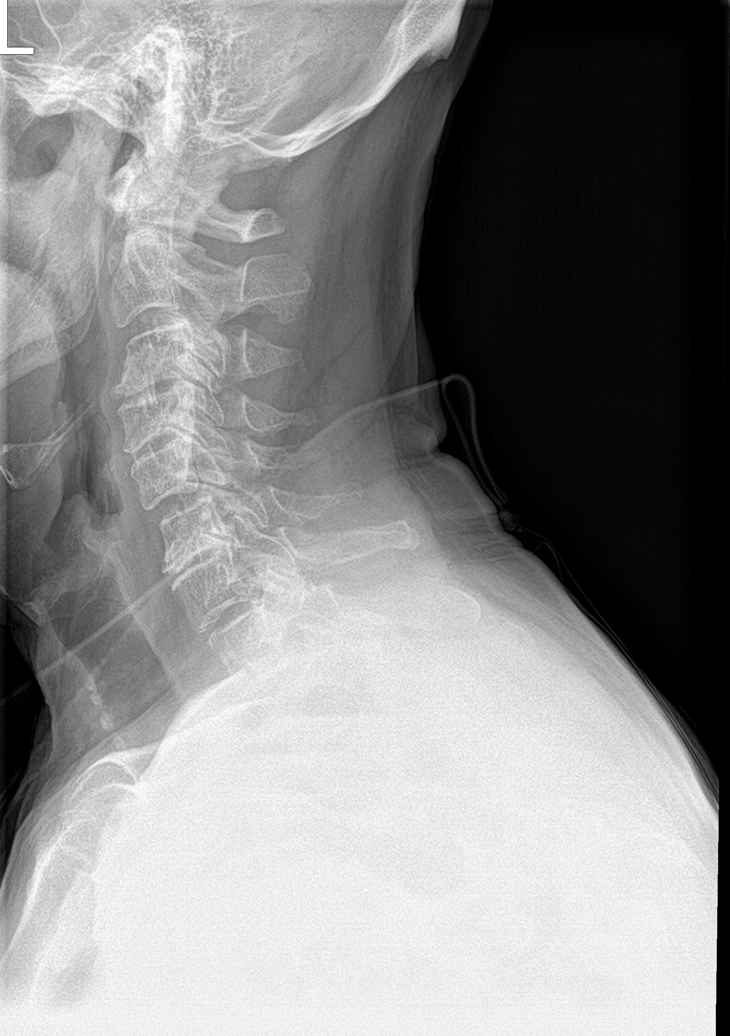

[c-spine obl (1 of 2)]
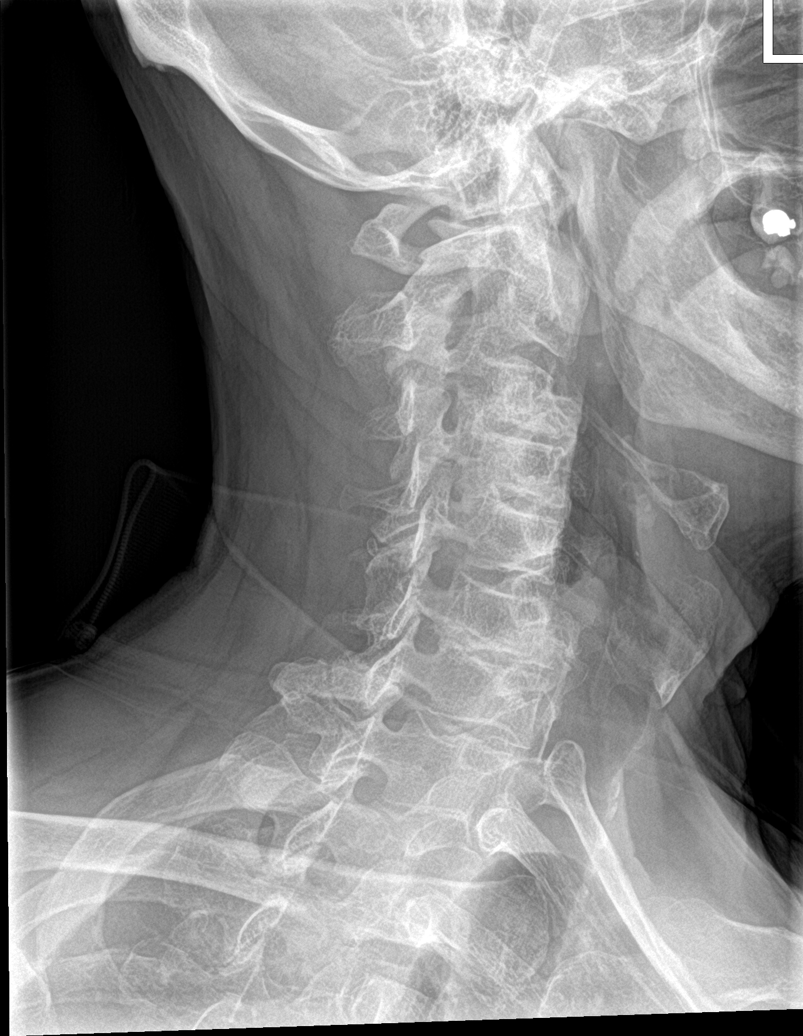

[c-spine obl (2 of 2)]
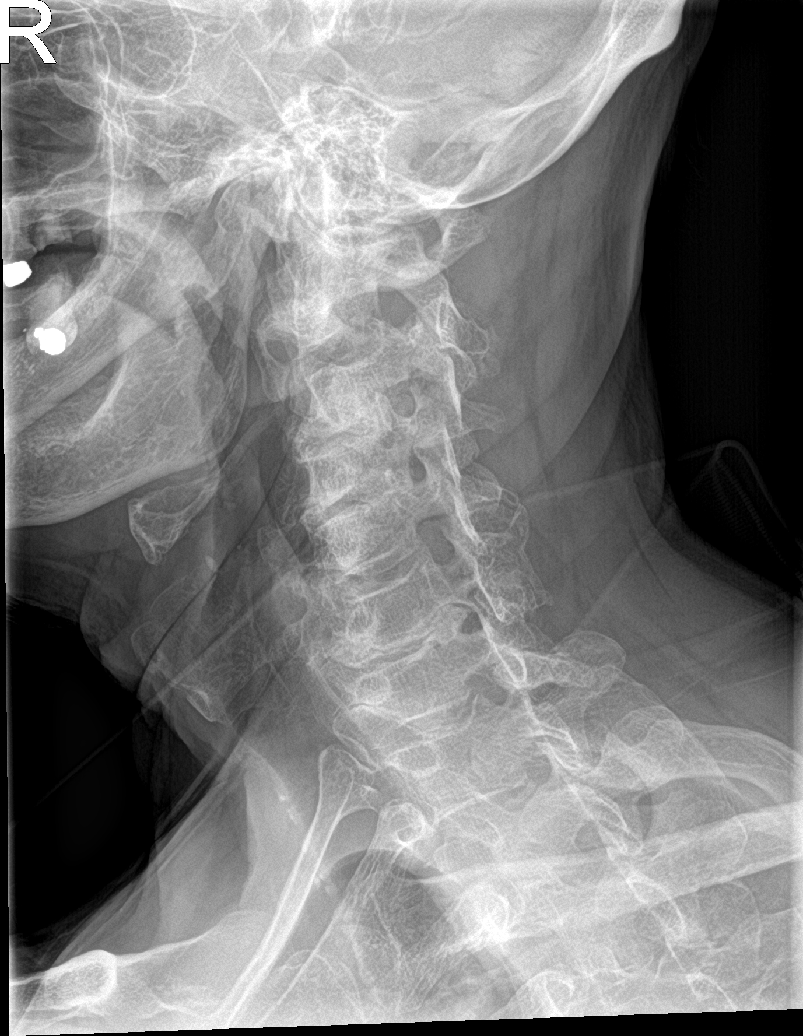

[c-spine ap]
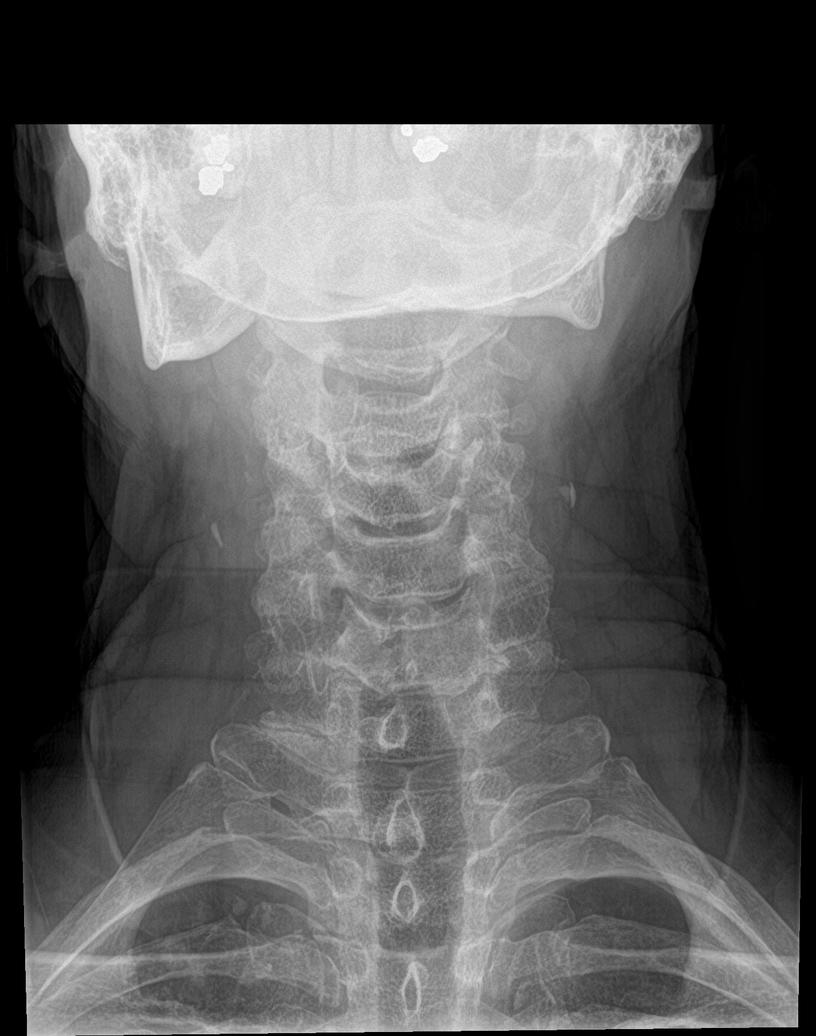

[c-spine open mouth]
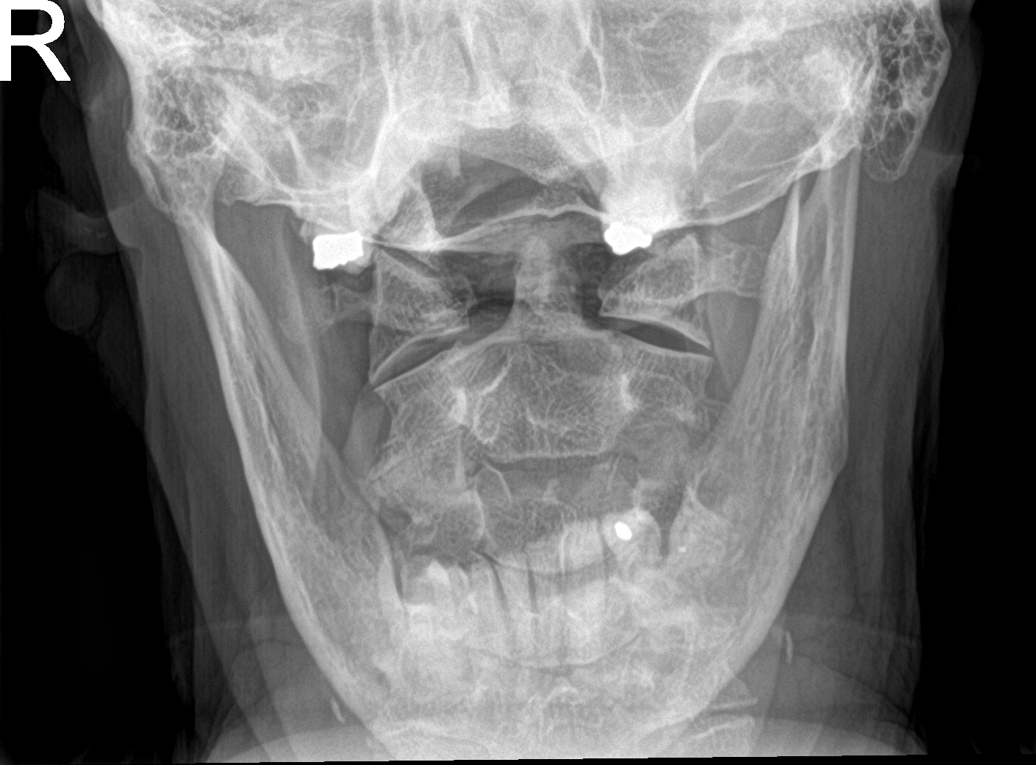

[5 of 5 positions shown; findings below may reference images not displayed]

FINDINGS: Grade 1 retrolisthesis of C3-4 is noted secondary to moderate
degenerative disc disease at this level. Grade 1 anterolisthesis of
C5-6 is noted secondary to posterior facet joint hypertrophy. Mild
degenerative disc disease is noted at C4-5. Moderate degenerative
disc disease is noted at C6-7. No fracture is noted. No prevertebral
soft tissue swelling is noted. Bilateral neural foraminal stenosis
is noted at C4-5 and C6-7 secondary to uncovertebral spurring.
IMPRESSION: Multilevel degenerative disc disease. Bilateral neural foraminal
stenosis is noted at multiple levels secondary to uncovertebral
spurring. No acute abnormality is noted.

## 2020-03-05 IMAGING — DX DG SHOULDER 2+V*L*
3 series · 3 of 3 positions shown · non-contrast
Comparison: None.

CLINICAL DATA: Left shoulder pain for 2 weeks without known injury.

EXAM:
LEFT SHOULDER - 2+ VIEW

[shoulder grashey]
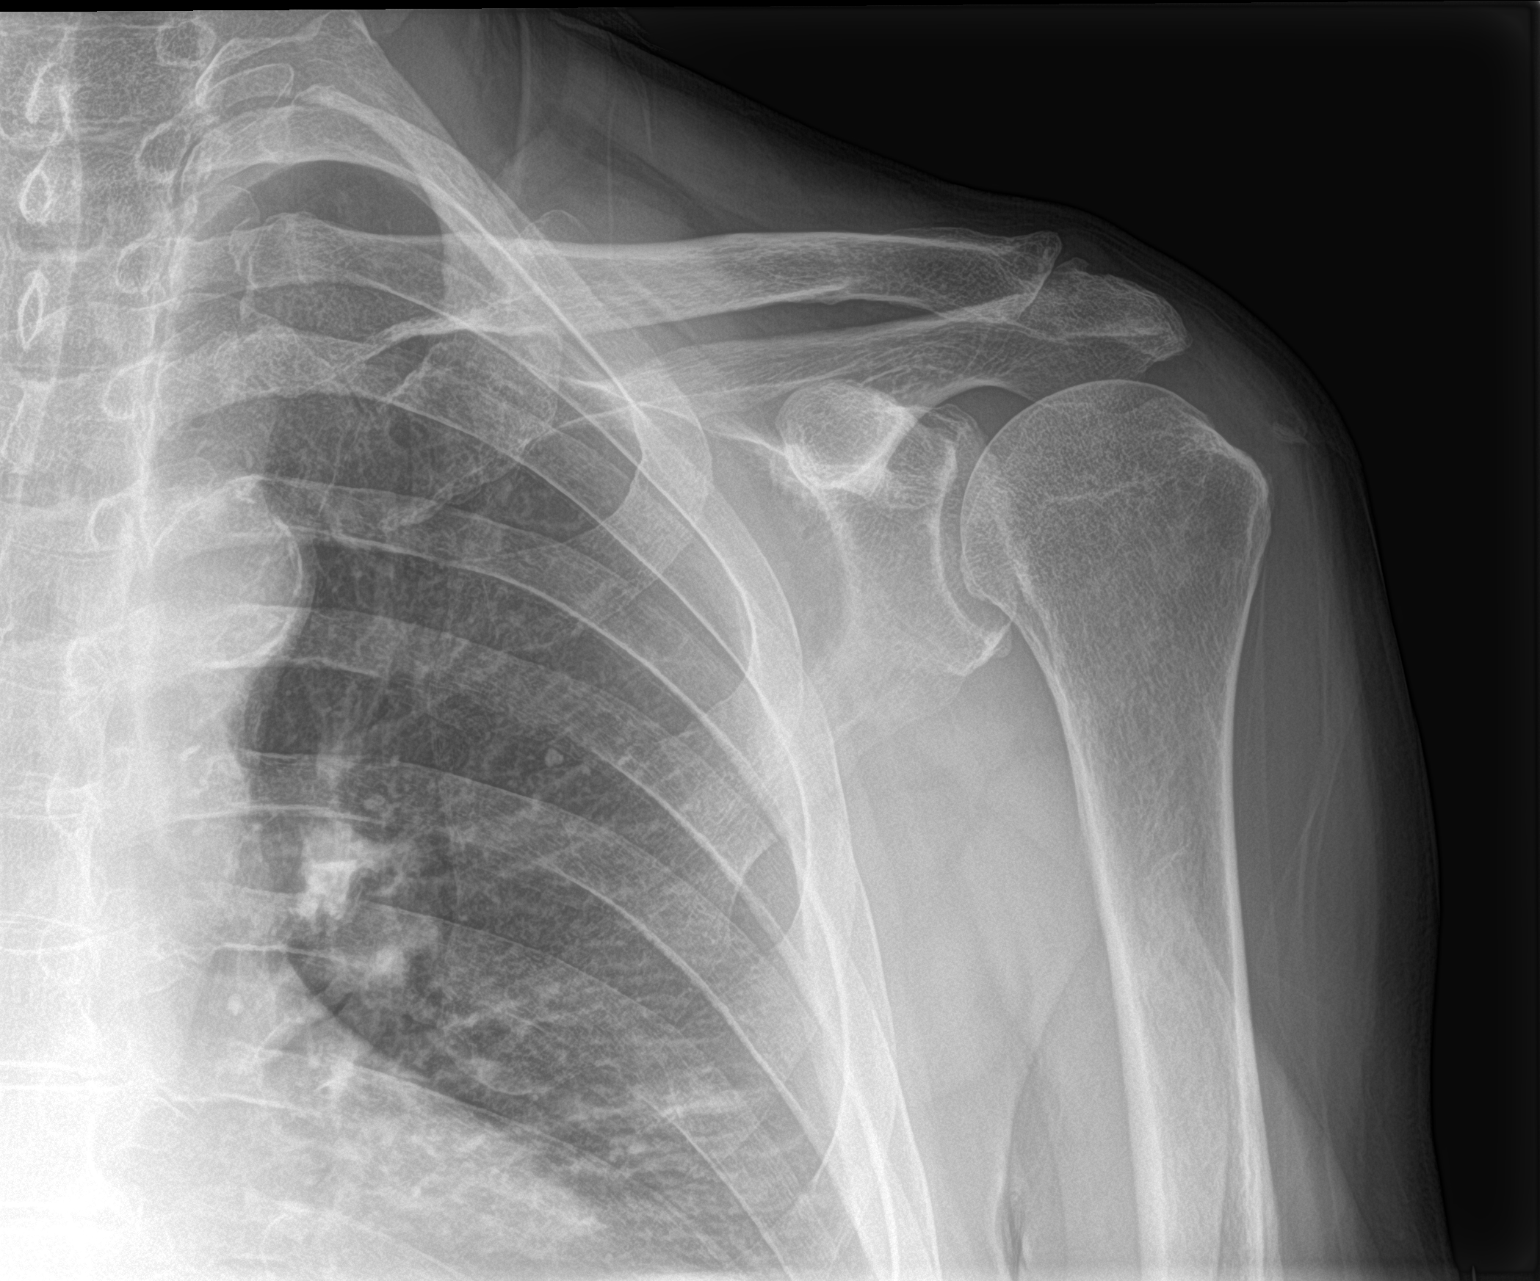

[shoulder y view]
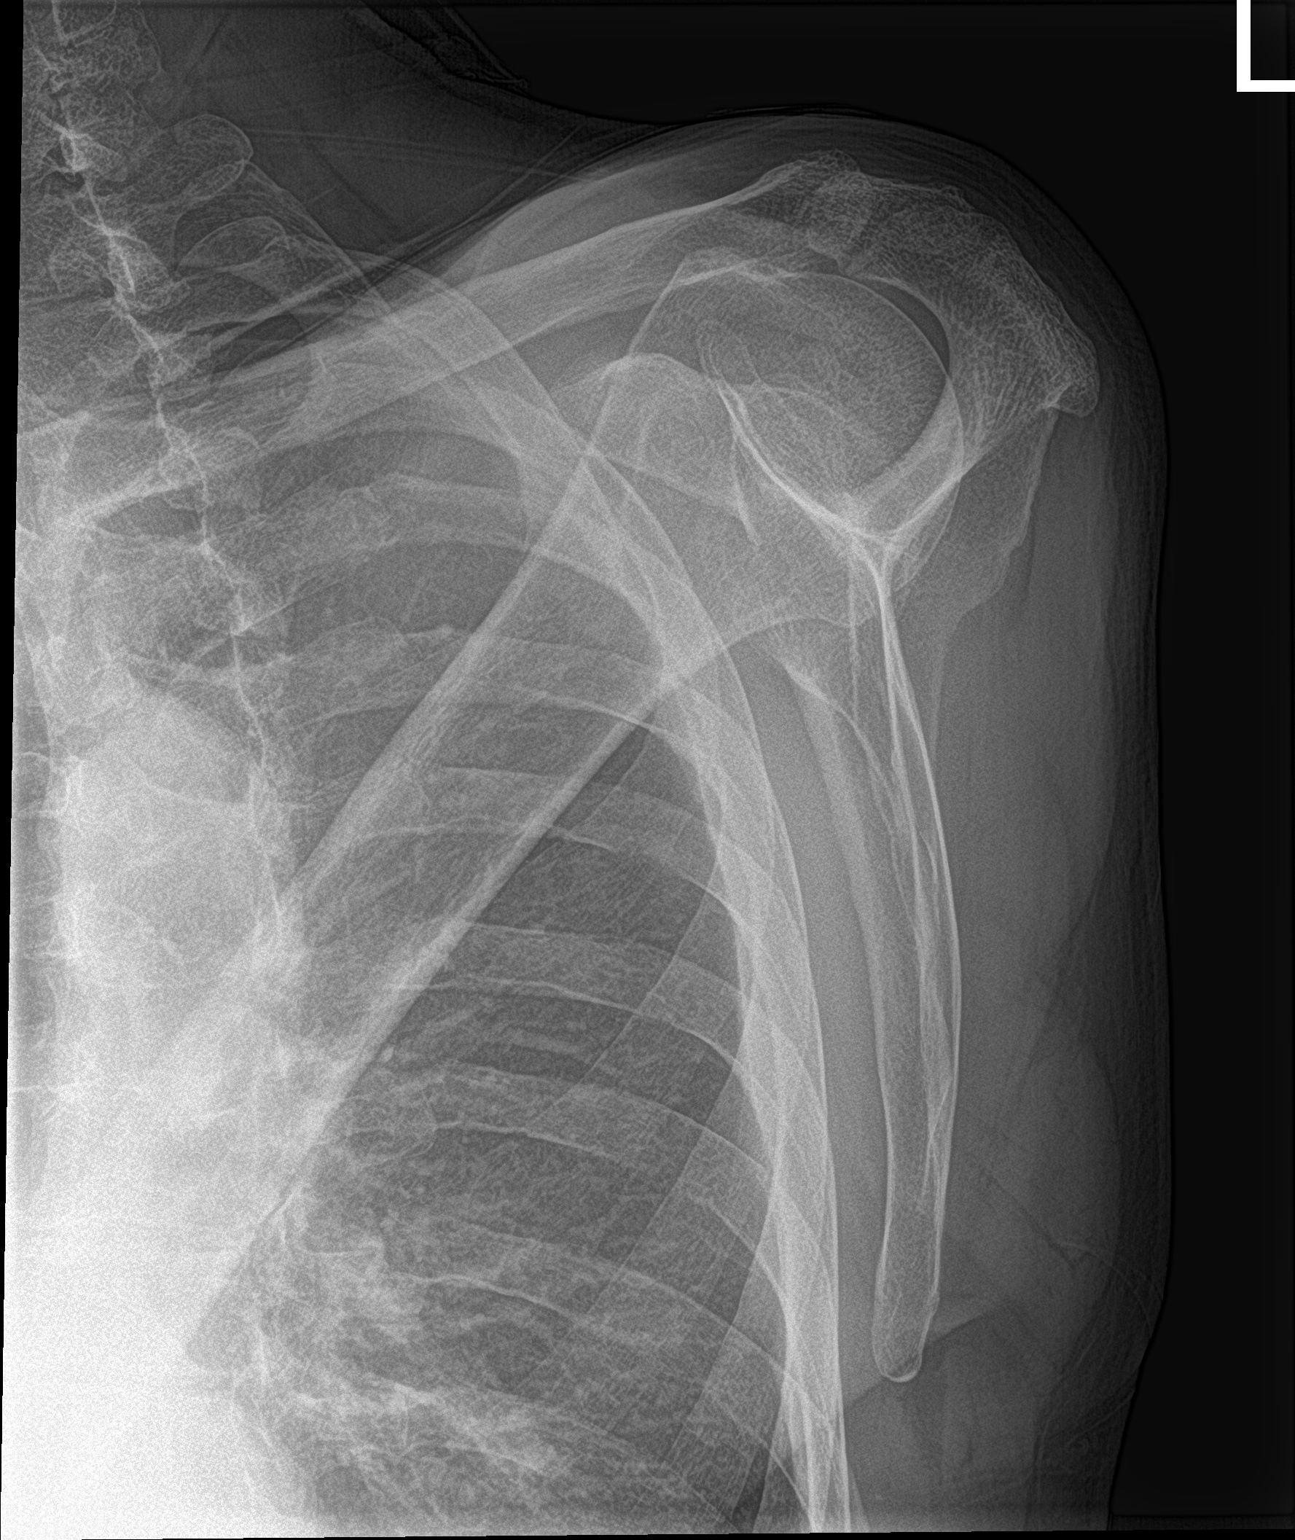

[shoulder axillary]
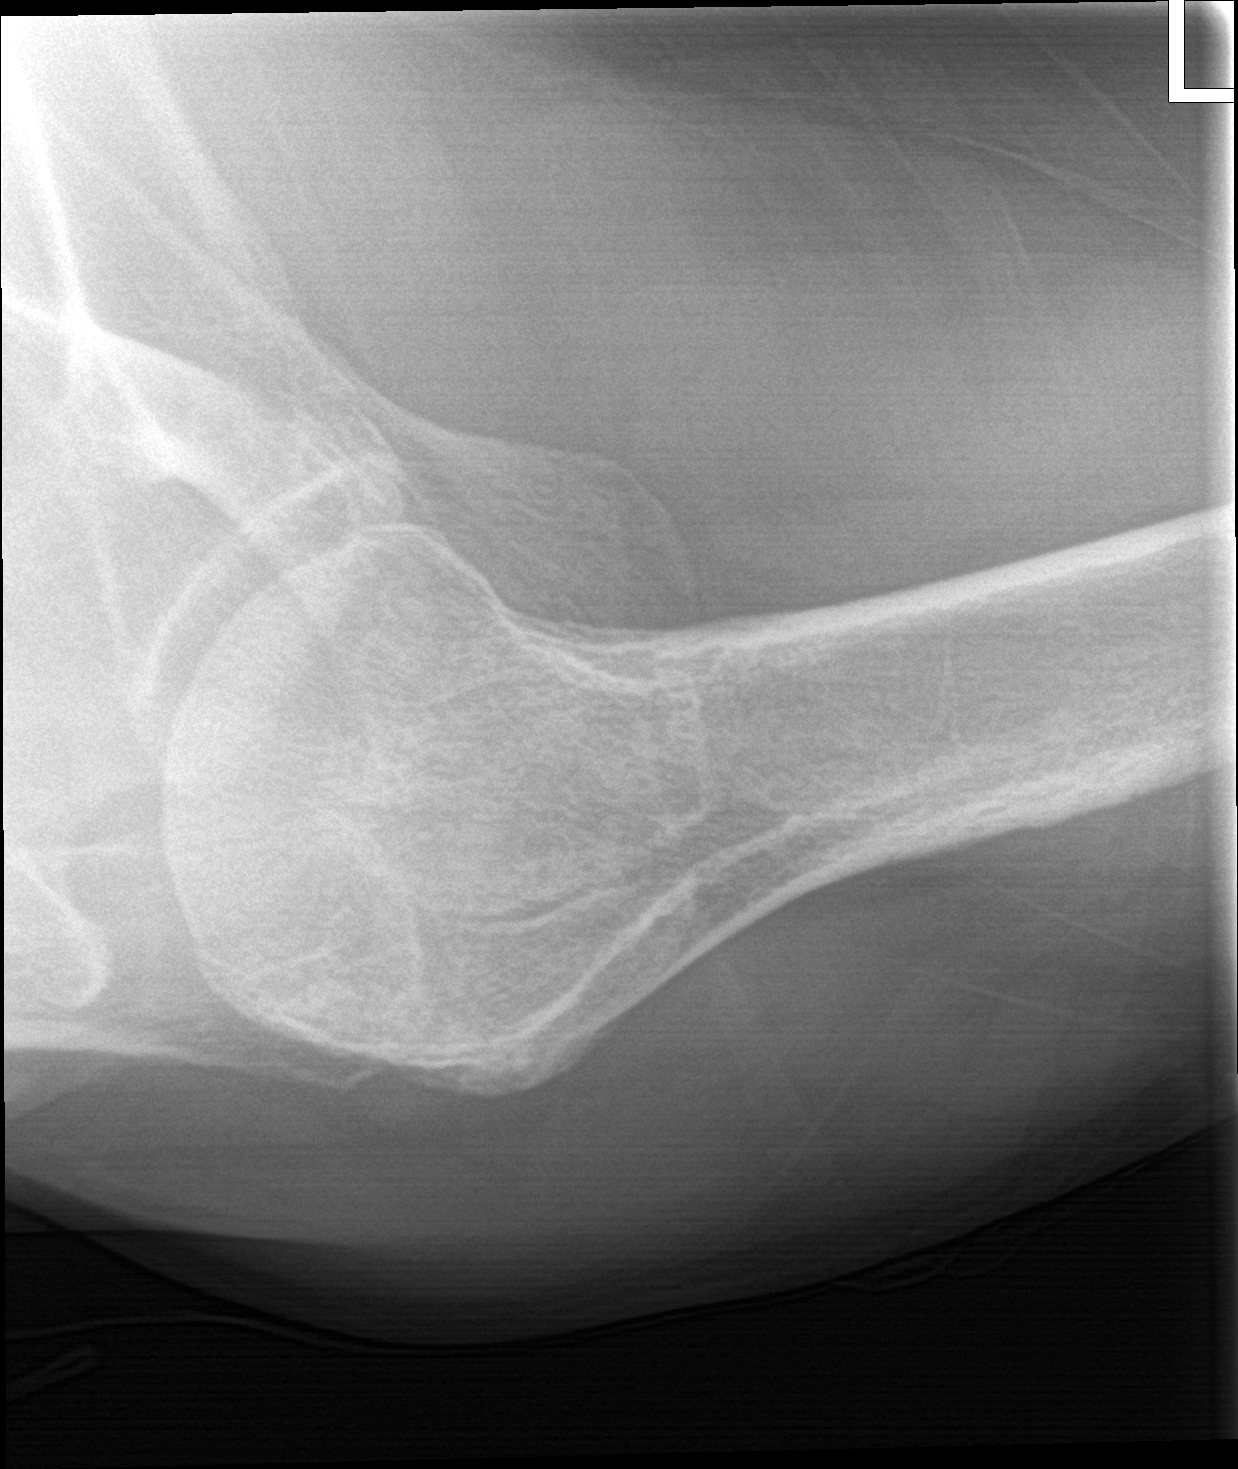

[3 of 3 positions shown; findings below may reference images not displayed]

FINDINGS: There is no evidence of fracture or dislocation. Moderate
degenerative changes seen involving the left acromioclavicular
joint. Soft tissues are unremarkable.
IMPRESSION: Moderate degenerative joint disease of the left acromioclavicular
joint. No acute abnormality seen.

## 2020-03-05 MED ORDER — TAMSULOSIN HCL 0.4 MG PO CAPS: 0.4000 mg | ORAL_CAPSULE | Freq: Two times a day (BID) | ORAL | 2 refills | Status: DC

## 2020-05-12 ENCOUNTER — Other Ambulatory Visit: Payer: Self-pay | Admitting: Family Medicine

## 2020-05-12 DIAGNOSIS — I10 Essential (primary) hypertension: Secondary | ICD-10-CM

## 2020-05-12 DIAGNOSIS — E782 Mixed hyperlipidemia: Secondary | ICD-10-CM

## 2020-07-21 ENCOUNTER — Telehealth: Payer: Self-pay | Admitting: Family Medicine

## 2020-07-21 NOTE — Chronic Care Management (AMB) (Signed)
  Chronic Care Management   Note  07/21/2020 Name: ROBERTS BON MRN: [id number] DOB: 23-Oct-1945  TOBEN ACUNA is a 75 y.o. year old male who is a primary care patient of Luetta Nutting, DO. I reached out to Gilles Chiquito by phone today in response to a referral sent by Mr. Jaidev Sanger [place]'s PCP, Luetta Nutting, DO.   Mr. Vanvranken was given information about Chronic Care Management services today including:  1. CCM service includes personalized support from designated clinical staff supervised by his physician, including individualized plan of care and coordination with other care providers 2. 24/7 contact phone numbers for assistance for urgent and routine care needs. 3. Service will only be billed when office clinical staff spend 20 minutes or more in a month to coordinate care. 4. Only one practitioner may furnish and bill the service in a calendar month. 5. The patient may stop CCM services at any time (effective at the end of the month) by phone call to the office staff.   Patient agreed to services and verbal consent obtained.   Follow up plan:   Lauretta Grill [facility] Scheduler

## 2020-08-06 ENCOUNTER — Ambulatory Visit: Payer: Medicare HMO | Admitting: Family Medicine

## 2020-08-07 ENCOUNTER — Ambulatory Visit: Payer: Medicare HMO | Admitting: Family Medicine

## 2020-08-13 ENCOUNTER — Other Ambulatory Visit: Payer: Self-pay

## 2020-08-13 ENCOUNTER — Ambulatory Visit (INDEPENDENT_AMBULATORY_CARE_PROVIDER_SITE_OTHER): Payer: Medicare HMO | Admitting: Family Medicine

## 2020-08-13 ENCOUNTER — Ambulatory Visit: Payer: Medicare HMO | Admitting: Family Medicine

## 2020-08-13 ENCOUNTER — Encounter: Payer: Self-pay | Admitting: Family Medicine

## 2020-08-13 VITALS — BP 131/82 | HR 89 | Temp 97.6°F | Ht 69.0 in | Wt 172.6 lb

## 2020-08-13 DIAGNOSIS — N401 Enlarged prostate with lower urinary tract symptoms: Secondary | ICD-10-CM

## 2020-08-13 DIAGNOSIS — E782 Mixed hyperlipidemia: Secondary | ICD-10-CM

## 2020-08-13 DIAGNOSIS — I110 Hypertensive heart disease with heart failure: Secondary | ICD-10-CM | POA: Diagnosis not present

## 2020-08-13 DIAGNOSIS — I10 Essential (primary) hypertension: Secondary | ICD-10-CM

## 2020-08-13 DIAGNOSIS — R7303 Prediabetes: Secondary | ICD-10-CM | POA: Diagnosis not present

## 2020-08-13 DIAGNOSIS — I509 Heart failure, unspecified: Secondary | ICD-10-CM | POA: Diagnosis not present

## 2020-08-13 DIAGNOSIS — R3912 Poor urinary stream: Secondary | ICD-10-CM

## 2020-08-13 NOTE — Assessment & Plan Note (Signed)
Blood pressure is at goal at for age and co-morbidities.  I recommend continuation of lisinopril at current strength.  In addition they were instructed to follow a low sodium diet with regular exercise to help to maintain adequate control of blood pressure.

## 2020-08-13 NOTE — Progress Notes (Signed)
David Maldonado - 75 y.o. male MRN [id number]  Date of birth: 1945-08-17  Subjective Chief Complaint  Patient presents with   Follow-up    HPI David Maldonado is a 75 y.o. male here today for follow up visit.  He reports that he is doing well.  He has no new concerns today.  He is moving back to [place] next month.  He is due for labs but would prefer to have these completed once he gets established there.    BP remains well controlled with lisinopril 20mg  daily.  No issues with tolerating medication.  He has not had chest pain, shortness of breath, palpitations, headache or vision changes.    BPH symptoms remain well controlled with combination of finasteride, tamsulosin and tadalafil.    He denies myalgias with pravastatin.   ROS:  A comprehensive ROS was completed and negative except as noted per HPI  Allergies  Allergen Reactions   Simvastatin Other (See Comments)    myalgia myalgia    Past Medical History:  Diagnosis Date   CHF (congestive heart failure) ([place])    Prediabetes 12/26/2017    Past Surgical History:  Procedure Laterality Date   CYSTOSCOPY  2016   HERNIA REPAIR     umbilical and R inguinal    Social History   Socioeconomic History   Marital status: Married    Spouse name: Arbie Cookey   Number of children: 2   Years of education: 14   Highest education level: 12th grade  Occupational History   Occupation: retired    Comment: truck Geophysicist/field seismologist  Tobacco Use   Smoking status: Every Day    Packs/day: 0.50    Pack years: 0.00    Types: Cigars, Cigarettes   Smokeless tobacco: Never  Vaping Use   Vaping Use: Never used  Substance and Sexual Activity   Alcohol use: Yes    Comment: 12 drinks a wk   Drug use: No   Sexual activity: Never  Other Topics Concern   Not on file  Social History Narrative   Plays golf during the week for 4 hours and walks the golf course.   Social Determinants of Health   Financial Resource Strain: Not on file  Food Insecurity:  Not on file  Transportation Needs: Not on file  Physical Activity: Not on file  Stress: Not on file  Social Connections: Not on file    No family history on file.  Health Maintenance  Topic Date Due   Hepatitis C Screening  Never done   Zoster Vaccines- Shingrix (1 of 2) Never done   COVID-19 Vaccine (4 - Booster for Moderna series) 04/18/2020   TETANUS/TDAP  02/05/2021 (Originally 03/01/2020)   INFLUENZA VACCINE  09/29/2020   COLONOSCOPY (Pts 45-60yrs Insurance coverage will need to be confirmed)  08/16/2028   PNA vac Low Risk Adult  Completed   HPV VACCINES  Aged Out     ----------------------------------------------------------------------------------------------------------------------------------------------------------------------------------------------------------------- Physical Exam BP 131/82 (BP Location: Left Arm, Patient Position: Sitting, Cuff Size: Normal)   Pulse 89   Temp 97.6 F (36.4 C)   Ht 5\' 9"  (1.753 m)   Wt 172 lb 9.6 oz (78.3 kg)   SpO2 95%   BMI 25.49 kg/m   Physical Exam Constitutional:      Appearance: Normal appearance.  HENT:     Head: Normocephalic and atraumatic.  Eyes:     General: No scleral icterus. Cardiovascular:     Rate and Rhythm: Normal rate and regular rhythm.  Pulmonary:     Effort: Pulmonary effort is normal.     Breath sounds: Normal breath sounds.  Musculoskeletal:     Cervical back: Neck supple.  Skin:    General: Skin is warm and dry.  Neurological:     General: No focal deficit present.     Mental Status: He is alert.    ------------------------------------------------------------------------------------------------------------------------------------------------------------------------------------------------------------------- Assessment and Plan  Essential hypertension Blood pressure is at goal at for age and co-morbidities.  I recommend continuation of lisinopril at current strength.  In addition they were  instructed to follow a low sodium diet with regular exercise to help to maintain adequate control of blood pressure.    Benign prostatic hyperplasia with weak urinary stream Continue tadalafil, tamsulosin and fiansteride  Hyperlipidemia Tolerating pravastatin well, will continue   No orders of the defined types were placed in this encounter.   No follow-ups on file.    This visit occurred during the SARS-CoV-2 public health emergency.  Safety protocols were in place, including screening questions prior to the visit, additional usage of staff PPE, and extensive cleaning of exam room while observing appropriate contact time as indicated for disinfecting solutions.

## 2020-08-13 NOTE — Assessment & Plan Note (Signed)
Continue tadalafil, tamsulosin and fiansteride

## 2020-08-13 NOTE — Assessment & Plan Note (Signed)
Tolerating pravastatin well, will continue

## 2020-08-19 ENCOUNTER — Ambulatory Visit: Payer: Medicare HMO

## 2020-12-08 ENCOUNTER — Other Ambulatory Visit: Payer: Self-pay | Admitting: Family Medicine

## 2020-12-08 DIAGNOSIS — R3912 Poor urinary stream: Secondary | ICD-10-CM

## 2020-12-08 DIAGNOSIS — N401 Enlarged prostate with lower urinary tract symptoms: Secondary | ICD-10-CM

## 2021-10-21 ENCOUNTER — Other Ambulatory Visit: Payer: Self-pay | Admitting: Family Medicine

## 2021-10-21 ENCOUNTER — Encounter: Payer: Self-pay | Admitting: General Practice

## 2021-10-21 DIAGNOSIS — N401 Enlarged prostate with lower urinary tract symptoms: Secondary | ICD-10-CM
